# Patient Record
Sex: Female | Born: 1940 | Race: White | Hispanic: No | Marital: Married | Smoking: Former smoker
Health system: Southern US, Community
[De-identification: ages and names within clinical notes are randomized; demographics above are authoritative.]

## PROBLEM LIST (undated history)

## (undated) DIAGNOSIS — E785 Hyperlipidemia, unspecified: Secondary | ICD-10-CM

## (undated) DIAGNOSIS — M19042 Primary osteoarthritis, left hand: Secondary | ICD-10-CM

## (undated) DIAGNOSIS — M19071 Primary osteoarthritis, right ankle and foot: Secondary | ICD-10-CM

## (undated) DIAGNOSIS — M19041 Primary osteoarthritis, right hand: Secondary | ICD-10-CM

## (undated) DIAGNOSIS — R918 Other nonspecific abnormal finding of lung field: Secondary | ICD-10-CM

## (undated) DIAGNOSIS — M1990 Unspecified osteoarthritis, unspecified site: Secondary | ICD-10-CM

## (undated) DIAGNOSIS — J40 Bronchitis, not specified as acute or chronic: Secondary | ICD-10-CM

## (undated) DIAGNOSIS — M19072 Primary osteoarthritis, left ankle and foot: Secondary | ICD-10-CM

## (undated) DIAGNOSIS — J329 Chronic sinusitis, unspecified: Secondary | ICD-10-CM

## (undated) DIAGNOSIS — M419 Scoliosis, unspecified: Secondary | ICD-10-CM

## (undated) HISTORY — DX: Primary osteoarthritis, right hand: M19.041

## (undated) HISTORY — DX: Primary osteoarthritis, left ankle and foot: M19.072

## (undated) HISTORY — DX: Primary osteoarthritis, left hand: M19.042

## (undated) HISTORY — DX: Hyperlipidemia, unspecified: E78.5

## (undated) HISTORY — DX: Scoliosis, unspecified: M41.9

## (undated) HISTORY — PX: APPENDECTOMY: SHX54

## (undated) HISTORY — PX: TOTAL ABDOMINAL HYSTERECTOMY: SHX209

## (undated) HISTORY — DX: Other nonspecific abnormal finding of lung field: R91.8

## (undated) HISTORY — DX: Chronic sinusitis, unspecified: J32.9

## (undated) HISTORY — DX: Primary osteoarthritis, right ankle and foot: M19.071

## (undated) HISTORY — DX: Unspecified osteoarthritis, unspecified site: M19.90

## (undated) HISTORY — DX: Bronchitis, not specified as acute or chronic: J40

---

## 1997-11-02 ENCOUNTER — Observation Stay (HOSPITAL_COMMUNITY): Admission: RE | Admit: 1997-11-02 | Discharge: 1997-11-03 | Payer: Self-pay | Admitting: Internal Medicine

## 1997-11-22 ENCOUNTER — Ambulatory Visit (HOSPITAL_COMMUNITY): Admission: RE | Admit: 1997-11-22 | Discharge: 1997-11-22 | Payer: Self-pay | Admitting: Internal Medicine

## 1998-05-25 ENCOUNTER — Ambulatory Visit (HOSPITAL_COMMUNITY): Admission: RE | Admit: 1998-05-25 | Discharge: 1998-05-25 | Payer: Self-pay | Admitting: Obstetrics & Gynecology

## 1998-08-16 ENCOUNTER — Other Ambulatory Visit: Admission: RE | Admit: 1998-08-16 | Discharge: 1998-08-16 | Payer: Self-pay | Admitting: Internal Medicine

## 2000-09-09 ENCOUNTER — Encounter: Payer: Self-pay | Admitting: Internal Medicine

## 2000-09-09 ENCOUNTER — Encounter: Admission: RE | Admit: 2000-09-09 | Discharge: 2000-09-09 | Payer: Self-pay | Admitting: Internal Medicine

## 2003-07-07 ENCOUNTER — Ambulatory Visit (HOSPITAL_COMMUNITY): Admission: RE | Admit: 2003-07-07 | Discharge: 2003-07-07 | Payer: Self-pay | Admitting: Gastroenterology

## 2004-09-09 ENCOUNTER — Emergency Department (HOSPITAL_COMMUNITY): Admission: EM | Admit: 2004-09-09 | Discharge: 2004-09-09 | Payer: Self-pay | Admitting: Emergency Medicine

## 2005-12-31 ENCOUNTER — Encounter: Admission: RE | Admit: 2005-12-31 | Discharge: 2005-12-31 | Payer: Self-pay | Admitting: *Deleted

## 2006-01-07 ENCOUNTER — Encounter: Admission: RE | Admit: 2006-01-07 | Discharge: 2006-01-07 | Payer: Self-pay | Admitting: *Deleted

## 2006-01-21 ENCOUNTER — Encounter: Admission: RE | Admit: 2006-01-21 | Discharge: 2006-01-21 | Payer: Self-pay | Admitting: Internal Medicine

## 2006-01-27 ENCOUNTER — Encounter: Admission: RE | Admit: 2006-01-27 | Discharge: 2006-01-27 | Payer: Self-pay | Admitting: Internal Medicine

## 2006-06-01 ENCOUNTER — Encounter: Payer: Self-pay | Admitting: Internal Medicine

## 2006-06-01 ENCOUNTER — Encounter: Admission: RE | Admit: 2006-06-01 | Discharge: 2006-06-01 | Payer: Self-pay | Admitting: Internal Medicine

## 2006-06-05 ENCOUNTER — Encounter: Admission: RE | Admit: 2006-06-05 | Discharge: 2006-06-05 | Payer: Self-pay | Admitting: Internal Medicine

## 2006-06-11 ENCOUNTER — Ambulatory Visit: Payer: Self-pay | Admitting: Internal Medicine

## 2006-08-05 ENCOUNTER — Ambulatory Visit: Payer: Self-pay | Admitting: Internal Medicine

## 2006-08-05 LAB — CONVERTED CEMR LAB
Basophils Absolute: 0 10*3/uL (ref 0.0–0.1)
Eosinophils Absolute: 0.1 10*3/uL (ref 0.0–0.6)
MCHC: 34.2 g/dL (ref 30.0–36.0)
Monocytes Absolute: 0.9 10*3/uL — ABNORMAL HIGH (ref 0.2–0.7)
Neutrophils Relative %: 67.5 % (ref 43.0–77.0)
RDW: 12.6 % (ref 11.5–14.6)
WBC: 9.8 10*3/uL (ref 4.5–10.5)

## 2006-12-30 ENCOUNTER — Ambulatory Visit (HOSPITAL_COMMUNITY): Admission: RE | Admit: 2006-12-30 | Discharge: 2006-12-30 | Payer: Self-pay | Admitting: Plastic Surgery

## 2007-07-13 IMAGING — CR DG CHEST 2V
2 series · 2 of 2 positions shown · non-contrast
Comparison: [REDACTED] chest x-ray 01/07/06, 12/31/05, and [REDACTED] chest x-ray report 09/09/00 (purged from file).

CLINICAL DATA: Previous smoker.  Followup pneumonia.  Some cough and congestion.  Slight improvement of symptoms.  Shortness of breath. 
 DIAGNOSTIC CHEST - 2 VIEW:

[view not recorded (1 of 2)]
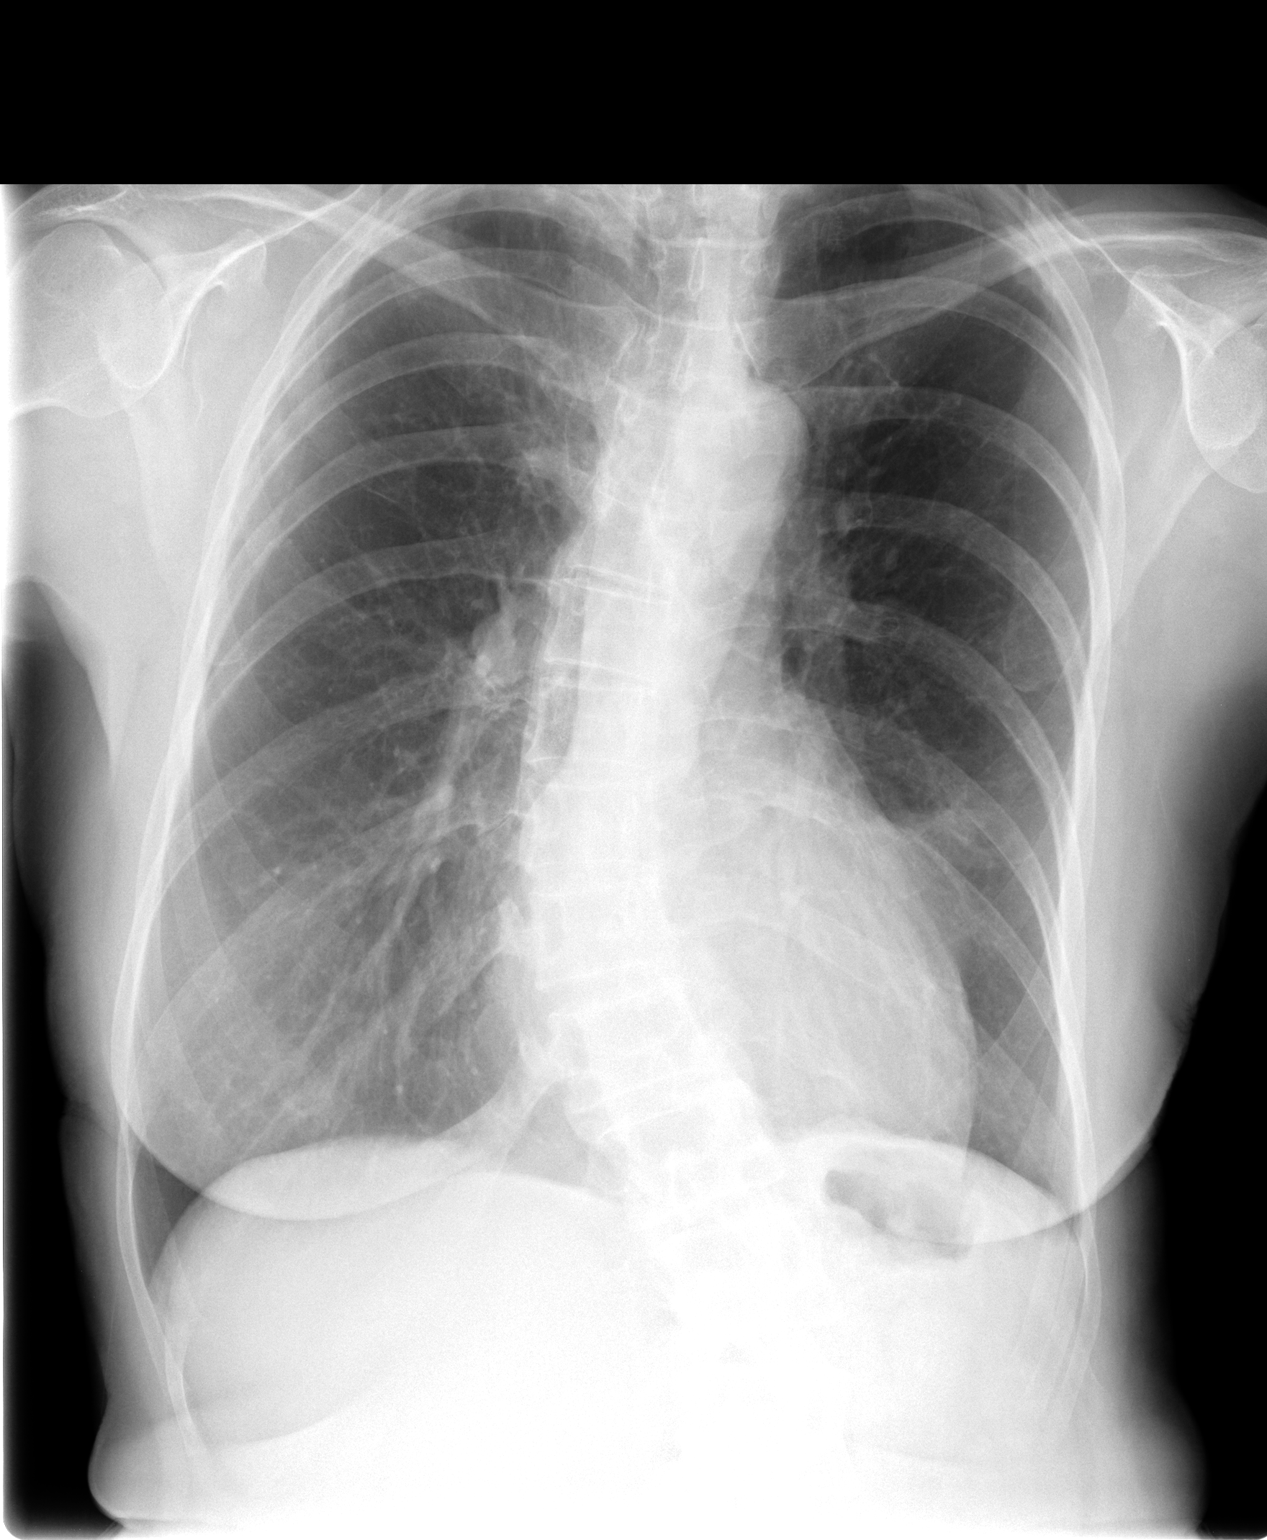

[view not recorded (2 of 2)]
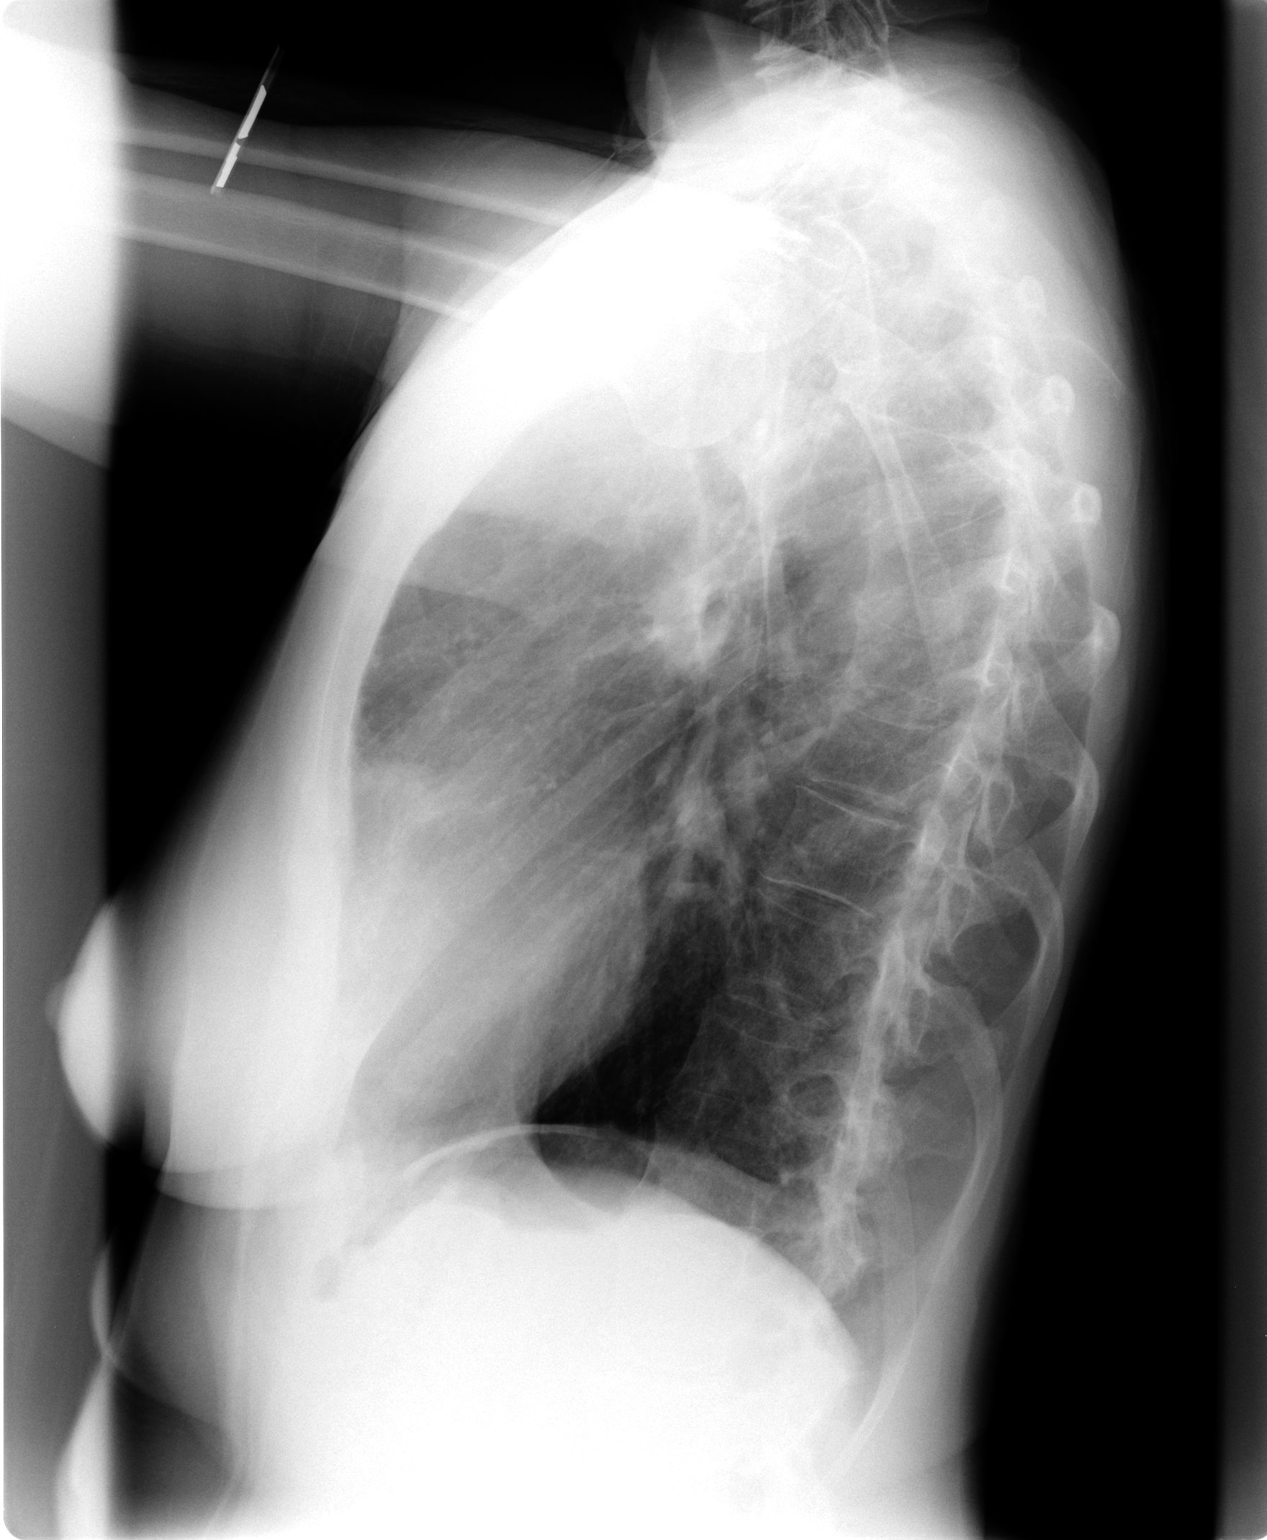

[2 of 2 positions shown; findings below may reference images not displayed]

FINDINGS: Amorphous 3 to 4 cm triangular opacity is seen of the lingula unchanged since prior studies since 12/31/05.  Stable hyperinflation of COPD is noted.  Heart size is normal.  Mediastinum, hila, pleura, and osseous structures to include moderate dextroscoliosis dorsal spine are unchanged.
IMPRESSION: 1.  No change in triangular-shaped lingular opacity consistent with persistent bronchopneumonia, atelectasis, scar, or neoplasm.   Recommend followup chest x-ray and/or chest CT to complete clearing. 
 2.  Stable slight COPD. 
 3.  Otherwise no active disease.

## 2010-03-19 ENCOUNTER — Encounter
Admission: RE | Admit: 2010-03-19 | Discharge: 2010-03-19 | Payer: Self-pay | Source: Home / Self Care | Attending: Family Medicine | Admitting: Family Medicine

## 2010-03-19 ENCOUNTER — Encounter: Payer: Self-pay | Admitting: Internal Medicine

## 2010-03-28 ENCOUNTER — Encounter
Admission: RE | Admit: 2010-03-28 | Discharge: 2010-03-28 | Payer: Self-pay | Source: Home / Self Care | Attending: Family Medicine | Admitting: Family Medicine

## 2010-04-30 ENCOUNTER — Encounter: Payer: Self-pay | Admitting: Internal Medicine

## 2010-04-30 ENCOUNTER — Institutional Professional Consult (permissible substitution) (INDEPENDENT_AMBULATORY_CARE_PROVIDER_SITE_OTHER): Payer: Medicare Other | Admitting: Internal Medicine

## 2010-04-30 DIAGNOSIS — J42 Unspecified chronic bronchitis: Secondary | ICD-10-CM

## 2010-04-30 DIAGNOSIS — R042 Hemoptysis: Secondary | ICD-10-CM | POA: Insufficient documentation

## 2010-05-03 DIAGNOSIS — E785 Hyperlipidemia, unspecified: Secondary | ICD-10-CM | POA: Insufficient documentation

## 2010-05-08 NOTE — Assessment & Plan Note (Signed)
Summary: lung nodules/hemoptysis/sh   Primary Provider/Referring Provider:  Dr Eligah East   History of Present Illness: 05-06-2010- 70 yoF seen w/ her husband on kind referral from Dr Paulino Rily because of abnormal CT scan. Previously seen here twice in 2008 for abnormal CT with stable lingular scarring, small nodules and improving Right lung ground glass density. PPD then was neg, SAR. 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 (Father died TB in sanatorium when she was 70 yo- her PPDs always negative) Recently she had bronchitis x 3 weeks, leading to CT1/05/2010:  Compared to 06/01/06 study the tree-in-bud and bronchiectasis were diffusely stable, with possibly increased scarring but no definite active process. alsno noted mild CE w/ coronary artery calcification.  During the bronhcitis she did cough some blood streaaks twice, while on aspirin. She had caught a cold from her grandchildren. She was not coughing before this acute bronchitis and is no longer coughing now. She claims to feel fine, and denies fever, night sweat, pain, nodes.Most recently she had a sinusitis resolved with antibiotics.   Preventive Screening-Counseling & Management  Alcohol-Tobacco     Smoking Status: quit     Packs/Day: 0.75     Year Started: 1972     Year Quit: 1992  Past History:  Family History: Last updated: 2010-05-06 Father died TB in sanatorium- patient was 70 yo- Her PPDs always                                                                              negative Mother - died pneumonia after a fall, 32 yo  Social History: Last updated: May 06, 2010 Married- Dannetta Lekas, children and grandchildren Retired [employer] college  Risk Factors: Smoking Status: quit (05-06-10) Packs/Day: 0.75 (05-06-2010)  Past Medical History: Bronchitis Lung nodules- CT 2008, 03/19/10 Hyperlipidemia sinusitis  Past Surgical  History: Appendectomy Total Abdominal Hysterectomy  Family History: Father died TB in sanatorium- patient was 70 yo- Her PPDs always                                                                              negative Mother - died pneumonia after a fall, 75 yo  Social History: Married- Eriel Dunckel, children and grandchildren 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  The patient denies shortness of breath at rest, productive cough, non-productive cough, coughing up blood, chest pain, irregular heartbeats, acid heartburn, indigestion, loss of appetite, weight change, abdominal pain, difficulty swallowing, sore throat, tooth/dental problems, headaches, nasal congestion/difficulty breathing through nose, sneezing, itching, ear ache, hand/feet swelling, rash, change in color of mucus, and fever.    Physical Exam  Additional Exam:  General: A/Ox3; pleasant and cooperative, NAD, slim, well-appearing  SKIN: no rash, lesions NODES: no lymphadenopathy HEENT: Ebro/AT, EOM- WNL, Conjuctivae- clear, PERRLA, TM-WNL, Nose- clear, Throat- clear and wnl, Mallampati  II NECK: Supple w/ fair ROM, JVD- none, normal carotid impulses w/o bruits Thyroid- normal to palpation CHEST: Clear to P&A, few inspiratory squeeks at R midback, no cough, unlabored HEART: RRR, no m/g/r heard ABDOMEN: Soft and nl; nml bowel sounds; no organomegaly or masses noted ZOX:WRUE, nl pulses, no edema, cyanosis or clubbing NEURO: Grossly intact to observation      Impression & Recommendations:  Problem # 1:  BRONCHITIS, CHRONIC (ICD-491.9) CXR and CT strongly suggest MAIC. Since she is currently completely asymptomatic, and was before the acute illness, we will follow only at long intervals, unless her pattern changes. She and her husband are comfortable with this approach, but we made it clear that if  symptoms changed, we would see her sooner. .   Problem # 2:  HEMOPTYSIS UNSPECIFIED (ICD-786.30) Transient bleed at time of bronchitis while on aspirin and without concerning lesion on CT. This was probably benign hemorrhagic bronchitis involving one of her bronhiectatic areas. Recurrent bleed would require reconsideration- probably bronchoscopy looking for [facility] or neoplasm.   Other Orders: Consultation Level IV (45409)  Patient Instructions: 1)  Please schedule a follow-up appointment in 1 year. 2)  If you find problems developing- persistent change in cough, night sweats, fever or coughing blood, then please let me know.  3)  Dr Paulino Rily

## 2010-06-19 ENCOUNTER — Other Ambulatory Visit: Payer: Self-pay | Admitting: Family Medicine

## 2010-06-19 DIAGNOSIS — R911 Solitary pulmonary nodule: Secondary | ICD-10-CM

## 2010-07-17 ENCOUNTER — Other Ambulatory Visit: Payer: Self-pay | Admitting: Family Medicine

## 2010-07-17 DIAGNOSIS — R9389 Abnormal findings on diagnostic imaging of other specified body structures: Secondary | ICD-10-CM

## 2010-07-30 NOTE — Assessment & Plan Note (Signed)
[facility]                             PULMONARY OFFICE NOTE   NAME:[name] [name], [name] [name]                   MRN:          [id number]  DATE:08/05/2006                            DOB:          01/15/1941    PROBLEMS:  1. Small bilateral lung nodules.  2. Seasonal allergic rhinitis.  3. Cough.  4. Chest pain.   HISTORY:  When I had seen her on March 27 initially, we had placed a PPD  skin test which was negative and had intended pulmonary function test be  scheduled, but these were not done. She had been visiting in [place],  [place] over the past weekend after driving down. She developed pleuritic  right lateral mid chest pain which took her breath and went to a  hospital there where she had a chest x-ray and EKG. The chest x-ray disc  shows two areas of faint rounded density in the right lung, no obvious  plural effusion, pneumothorax, or other definite process. This is  compared with her CT scan of March 17 which had seen no evidence of  enlarging lung nodule, but had noted progression of scattered clustered  micro nodules suspicious for [facility] and also a probable hypodense right  hepatic lobe lesion. She was diagnosed with pleurisy. She apparently was  not given any specific therapy and did not have much in the way of lab  tests. I noted that she had driven to [place], as she is about to fly  now to the [place] for a vacation. She has had some leg cramps off and  on bilaterally, apparently not new. She has no history of DVT in self or  family. One sister had a CVA.   OBJECTIVE:  Weight 125 pounds, blood pressure 110/68, pulse regular 79,  room air saturation 99%. Alert, trim, and apparently comfortable with a  dry cough. No plural rub, rales, or wheeze. Heart sounds regular without  murmur. No neck vein distension. No adenopathy. Trim legs with no cords,  no tenderness, negative Homan's, no cyanosis or clubbing.   IMPRESSION:  1. 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  2. New onset of pleuritic pain which is now substantially improved.      This may have been something musculoskeletal like a pinched nerve      (she blames picking up her grandchild), but I cannot rule out      pulmonary embolism and this was discussed with her and her husband.      This was earlier than we had otherwise intended repeating her CT      scan.   PLAN:  1. Avoid stasis in the legs.  2. CBC with diff and D-dimer are drawn.  3. If D-dimer returns abnormal, we will work her up for a DVT,      although her legs would seem      to be low risk.  4. We are scheduling appointment in two months, anticipating follow up      CT scan, earlier p.r.n.     Clinton D. Maple Hudson, MD, FCCP, FACP  Electronically Signed  CDY/MedQ  DD: 08/05/2006  DT: 08/06/2006  Job #: [id number]   cc:   [facility] @ [facility]

## 2010-08-02 NOTE — Assessment & Plan Note (Signed)
[facility]                             PULMONARY OFFICE NOTE   NAME:[name], [name] CHILCOTT                   MRN:          [id number]  DATE:06/11/2006                            DOB:          26-Aug-1940    PROBLEM:  A 70 year old former smoker, seen at the kind request of Dr.  Olena Leatherwood in pulmonary consultation because of an abnormal chest CT.   HISTORY:  She has had a history of some episodic bronchitis.  In the  Fall of 2007, she had an illness of cough, but no fever or purulent  sputum.  Chest x-ray was interpreted as pneumonia.  She took a  prednisone taper then an antibiotic and felt well for 4-6 weeks.  Now in  the past 2-3 weeks she has had rhinorrhea, recurrence of cough and some  sore throat which she blames on allergy.  This by taking daily Claritin  on a chronic basis.  Cough is usually worst at night and on first waking  in the morning.  She may be aggravated with exposure to smoke and  strong, new fabric odors, which also cause sneezing.  Chest x-ray  suggested a nodule and she had a chest CT at [facility] on  November 13 which related the nodule to an area of scarring in the  lingula, but also noted  ground glass opacity in the right perihilar  region and a 7 mm nodule in the left upper lobe.  Followup CT was  recommended, and subsequently obtained on March 17 of this year.  The  second CT showed no evidence of enlarging lung nodule, but demonstrated  stable small nodules and an area of ground glass density in the right  lung which had improved.  Ongoing surveillance is recommended.  Progression of scattered, clustered micro nodules, especially in the mid  lung zones, was associated with some tree-in-bud change, and the  possibility of atypical infection, such as mycobacterium avium infection  was raised.  An abnormal area on the right liver was commented on.   MEDICATIONS:  1. Aspirin 81 mg.  2. Claritin D.  3. Prilosec.  4.  Multivitamin  5. Fish oil.  6. Glucosamine.  7. Fosamax.  8. Lunesta 2 mg.   Drug intolerant to CODEINE.   REVIEW OF SYSTEMS:  Nonproductive cough, sore throats.  Weight has been  stable.  Sputum white.  She has not had recent fever, adenopathy, rash,  bleeding, or chest pain.  She needs Prilosec to prevent heartburn, but  does not feel reflux events.  Her breathing does not wake her.   PAST HISTORY:  Seasonal allergic rhinitis and asthma as a child.  Elevated cholesterol.  Surgeries for hysterectomy and appendectomy.  Tuberculosis exposure to her father who died of tuberculosis when she  was 70 years old.  She thinks he may have gone to a sanatorium for a  while.  Her TB skin tests have always been negative.  She denies  intolerance to latex, contrast dye, or aspirin.   SOCIAL HISTORY:  Smoked 1/2 pack per day for 18-20 years, quitting 10  years ago.  Married, retired as Photographer in an office  environment.  She had traveled to [place] 5 or 6 years ago, and lived  for 3 years in [place].  Home environment:  Old home, no basement, oil  heat, carpet, no pets, no significant mold or mildew.  Husband sleeps on  a feather pillow, hers is foam.   FAMILY HISTORY:  Sister with cerebrovascular accident.  Mother and  grandmother had rheumatoid arthritis.  Nobody with known respiratory  problems.   OBJECTIVE:  Weight 132 pounds, BP 120/70, pulse regular 80, room air  saturation 98%.  Comfortable-appearing woman of medium build.  SKIN:  No rash.  ADENOPATHY:  None found.  HEENT:  Nose and throat clear.  No suggestion of pharyngeal irritation  or postnasal drip.  No stridor, neck vein distension, or hoarseness.  CHEST:  Mild, dry cough, question trace crackle.  Work of breathing was  not increased, there was no dullness or rub.  Heart sounds were regular without murmur or gallop.  I could not feel liver or spleen.  EXTREMITIES:  Without cyanosis, clubbing, or edema.    RADIOLOGY:  CT reports as above.   IMPRESSION:  1. Small bilateral lung nodules, would be consistent with an atypical      infection such as MAIC, but note that she had possible childhood      exposure to tuberculosis.  2. Seasonal allergic rhinitis.  3. Cough, possibly multifactorial.   PLAN:  1. We have placed a TB skin test.  2. Schedule pulmonary functions.  3. She will compare Zyrtec and Claritin over the counter for      antihistamine effect.  4. Schedule return in 1 month to follow up on tests, and to discuss      how to follow her chest CT picture.  We did not ask for sputum      culture, because currently she is not bringing up anything.  I      discussed availability of bronchoscopy to sample her lungs, but we      felt that we should wait as long as she was relatively      asymptomatic, but we are watching the status of her cough.  I      appreciate the chance to meet her.    Clinton D. Maple Hudson, MD, Tonny Bollman, FACP  Electronically Signed   CDY/MedQ  DD: 06/12/2006  DT: 06/13/2006  Job #: [id number]   cc:   Ladell Pier, M.D.

## 2010-09-16 ENCOUNTER — Other Ambulatory Visit: Payer: Self-pay | Admitting: Family Medicine

## 2010-09-16 DIAGNOSIS — E042 Nontoxic multinodular goiter: Secondary | ICD-10-CM

## 2010-12-03 ENCOUNTER — Other Ambulatory Visit: Payer: Self-pay | Admitting: Family Medicine

## 2010-12-03 DIAGNOSIS — E042 Nontoxic multinodular goiter: Secondary | ICD-10-CM

## 2010-12-04 ENCOUNTER — Ambulatory Visit
Admission: RE | Admit: 2010-12-04 | Discharge: 2010-12-04 | Disposition: A | Discharge status: Home / Self Care | Payer: Medicare Other | Source: Ambulatory Visit | Attending: Family Medicine | Admitting: Family Medicine

## 2010-12-04 DIAGNOSIS — E042 Nontoxic multinodular goiter: Secondary | ICD-10-CM

## 2011-05-01 ENCOUNTER — Ambulatory Visit: Payer: Medicare Other | Admitting: Internal Medicine

## 2011-05-22 ENCOUNTER — Other Ambulatory Visit: Payer: Self-pay | Admitting: Family Medicine

## 2011-05-22 DIAGNOSIS — E041 Nontoxic single thyroid nodule: Secondary | ICD-10-CM

## 2011-05-26 ENCOUNTER — Encounter: Payer: Self-pay | Admitting: Internal Medicine

## 2011-05-27 ENCOUNTER — Ambulatory Visit (INDEPENDENT_AMBULATORY_CARE_PROVIDER_SITE_OTHER): Payer: Medicare Other | Admitting: Internal Medicine

## 2011-05-27 ENCOUNTER — Encounter: Payer: Self-pay | Admitting: Internal Medicine

## 2011-05-27 DIAGNOSIS — J42 Unspecified chronic bronchitis: Secondary | ICD-10-CM

## 2011-05-27 DIAGNOSIS — J479 Bronchiectasis, uncomplicated: Secondary | ICD-10-CM

## 2011-05-27 DIAGNOSIS — R042 Hemoptysis: Secondary | ICD-10-CM

## 2011-05-27 DIAGNOSIS — I2510 Atherosclerotic heart disease of native coronary artery without angina pectoris: Secondary | ICD-10-CM

## 2011-05-27 NOTE — Patient Instructions (Signed)
Order- Non Contrast CT Chest - dx bronchiectasis, compare with 2012  We will call you with the CT results.  Please call as needed

## 2011-05-27 NOTE — Progress Notes (Signed)
05/27/11- 70 yoF followed for hx chronic bronchitis w/ hx hemoptysis LOV- 04/30/10  Husband here He is back from vacation in [place]. Head cold with sinus infection which resolved with 2 rounds of Z-Pak. Feels well. No blood. No recent cough, night sweats, swollen glands or shortness of breath. She had had pneumonia a year and a half ago. CT chest 03/19/2010-"tree in bud" appearance in the upper lobes with mild bronchiectasis suggesting atypical AFB infection. Mild cardiomegaly with coronary artery calcification. We reviewed these. Her sister had had CVA x2. Father died of tuberculosis.  ROS-see HPI Constitutional:   No-   weight loss, night sweats, fevers, chills, fatigue, lassitude. HEENT:   No-  headaches, difficulty swallowing, tooth/dental problems, sore throat,       No-  sneezing, itching, ear ache, nasal congestion, post nasal drip,  CV:  No-   chest pain, orthopnea, PND, swelling in lower extremities, anasarca, dizziness, palpitations Resp: No-   shortness of breath with exertion or at rest.              No-   productive cough,  No non-productive cough,  No- coughing up of blood.              No-   change in color of mucus.  No- wheezing.   Skin: No-   rash or lesions. GI:  No-   heartburn, indigestion, abdominal pain, nausea, vomiting GU: . MS:  No-   joint pain or swelling.  Neuro-     nothing unusual Psych:  No- change in mood or affect. No depression or anxiety.  No memory loss.  OBJ- Physical Exam General- Alert, Oriented, Affect-appropriate, Distress- none acute Skin- rash-none, lesions- none, excoriation- none Lymphadenopathy- none Head- atraumatic            Eyes- Gross vision intact, PERRLA, conjunctivae and secretions clear            Ears- Hearing, canals-normal            Nose- Clear, no-Septal dev, mucus, polyps, erosion, perforation             Throat- Mallampati II , mucosa clear , drainage- none, tonsils- atrophic Neck- flexible , trachea midline, no stridor ,  thyroid nl, carotid no bruit Chest - symmetrical excursion , unlabored           Heart/CV- RRR , no murmur , no gallop  , no rub, nl s1 s2                           - JVD- none , edema- none, stasis changes- none, varices- none           Lung- clear to P&A, wheeze- none, cough- none , dullness-none, rub- none           Chest wall-  Abd- Br/ Gen/ Rectal- Not done, not indicated Extrem- cyanosis- none, clubbing, none, atrophy- none, strength- nl Neuro- grossly intact to observation

## 2011-05-31 DIAGNOSIS — I2510 Atherosclerotic heart disease of native coronary artery without angina pectoris: Secondary | ICD-10-CM | POA: Insufficient documentation

## 2011-05-31 NOTE — Assessment & Plan Note (Signed)
I explained this observation does not necessarily imply impending occlusion. I asked her to discuss it with Dr. Cliffton Asters.

## 2011-05-31 NOTE — Assessment & Plan Note (Signed)
Context was consistent with a hemorrhagic bronchitis. There has been no recurrence.

## 2011-05-31 NOTE — Assessment & Plan Note (Signed)
We discussed options and decided to get a followup chest CT, looking for progression of her bronchiectasis despite lack of symptoms.

## 2011-06-03 ENCOUNTER — Ambulatory Visit
Admission: RE | Admit: 2011-06-03 | Discharge: 2011-06-03 | Disposition: A | Discharge status: Home / Self Care | Payer: Medicare Other | Source: Ambulatory Visit | Attending: Family Medicine | Admitting: Family Medicine

## 2011-06-03 ENCOUNTER — Ambulatory Visit (INDEPENDENT_AMBULATORY_CARE_PROVIDER_SITE_OTHER)
Admission: RE | Admit: 2011-06-03 | Discharge: 2011-06-03 | Disposition: A | Discharge status: Home / Self Care | Payer: Medicare Other | Source: Ambulatory Visit | Attending: Internal Medicine | Admitting: Internal Medicine

## 2011-06-03 DIAGNOSIS — J479 Bronchiectasis, uncomplicated: Secondary | ICD-10-CM

## 2011-06-03 DIAGNOSIS — E041 Nontoxic single thyroid nodule: Secondary | ICD-10-CM

## 2011-09-17 IMAGING — US US SOFT TISSUE HEAD/NECK
1 series · 14 of 25 positions shown · non-contrast
Comparison: Chest CT 03/19/2010

CLINICAL DATA: Thyroid nodule

THYROID ULTRASOUND
TECHNIQUE: Ultrasound examination of the thyroid gland and adjacent
soft tissues was performed.

[Series 1: us soft tissue head/neck · 0.05mm/px · 14 of 54 slices shown]
[im 1/54]
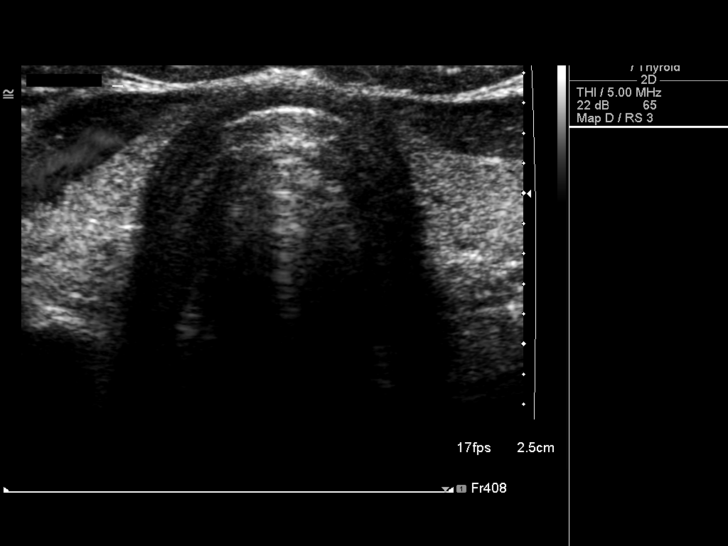
[im 5/54]
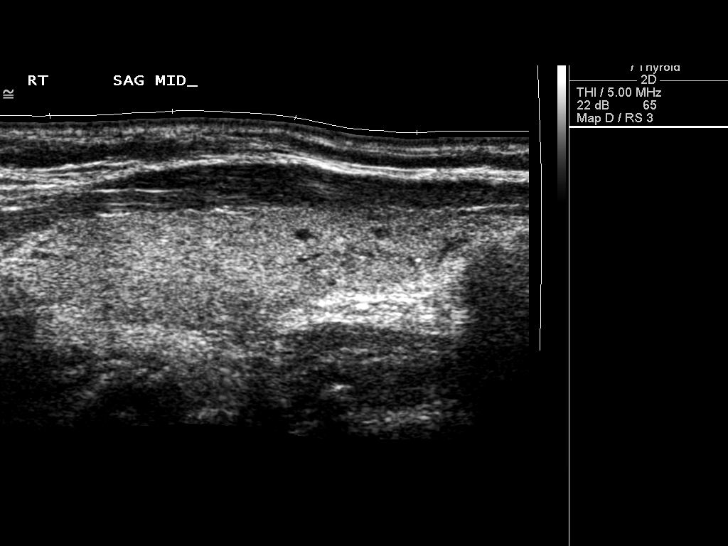
[im 9/54]
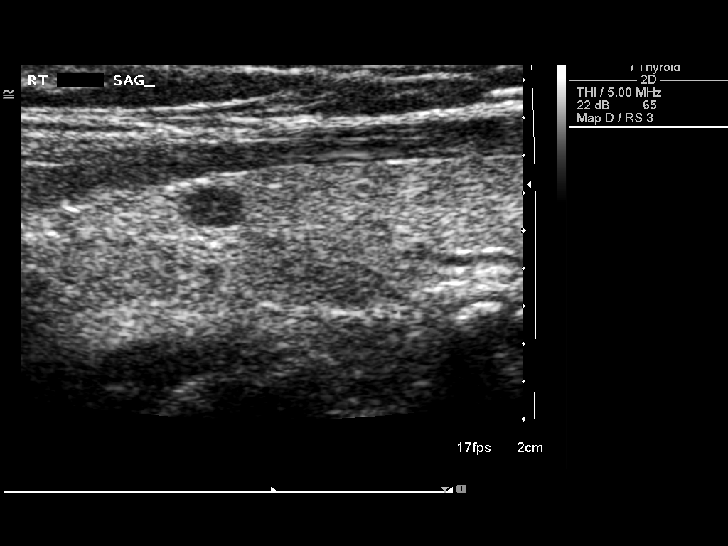
[im 14/54]
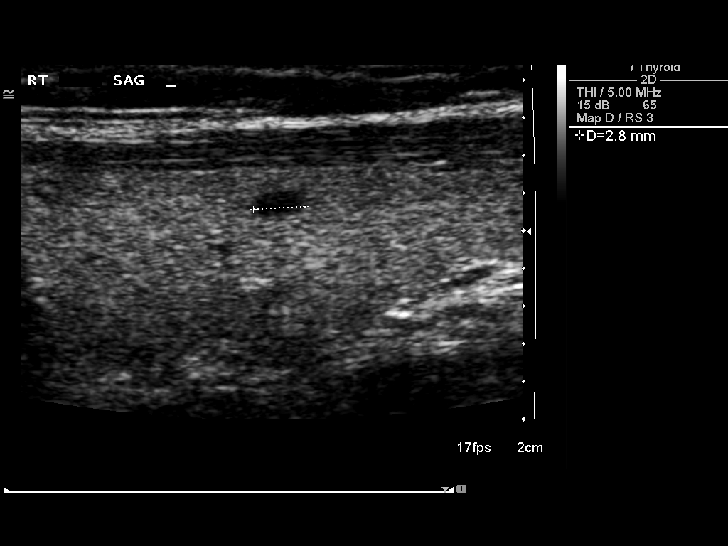
[im 18/54]
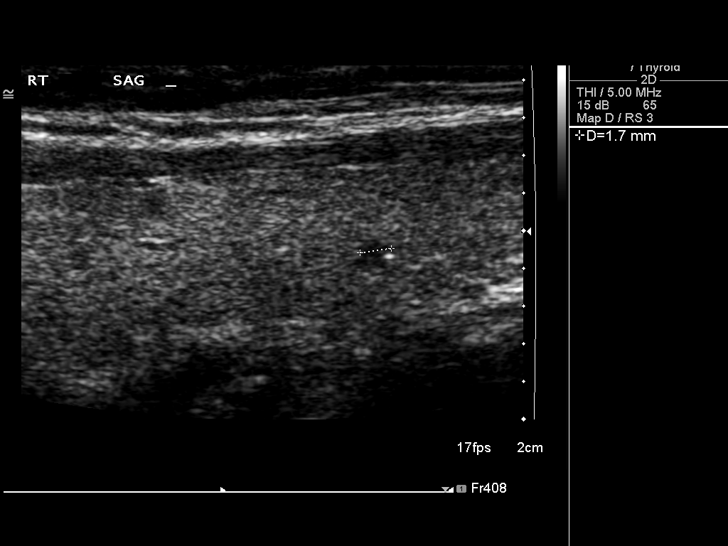
[im 20/54]
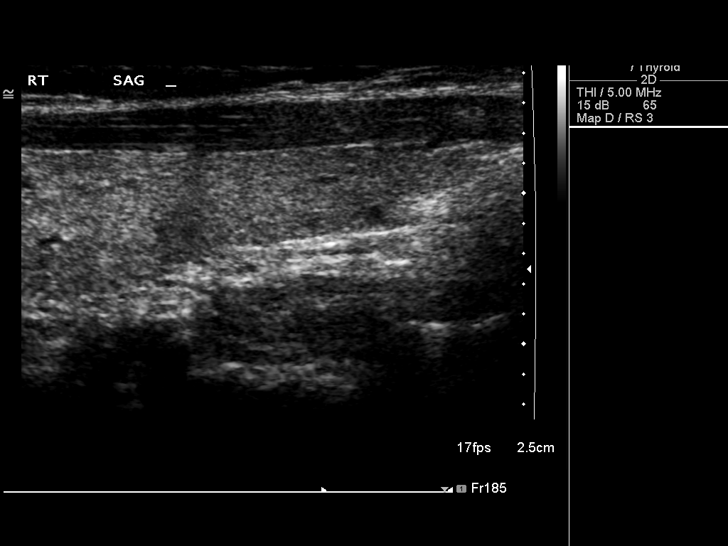
[im 25/54]
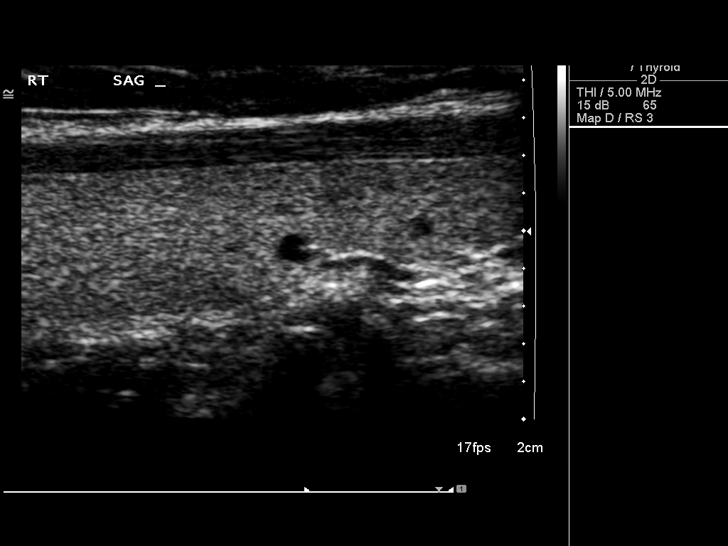
[im 29/54]
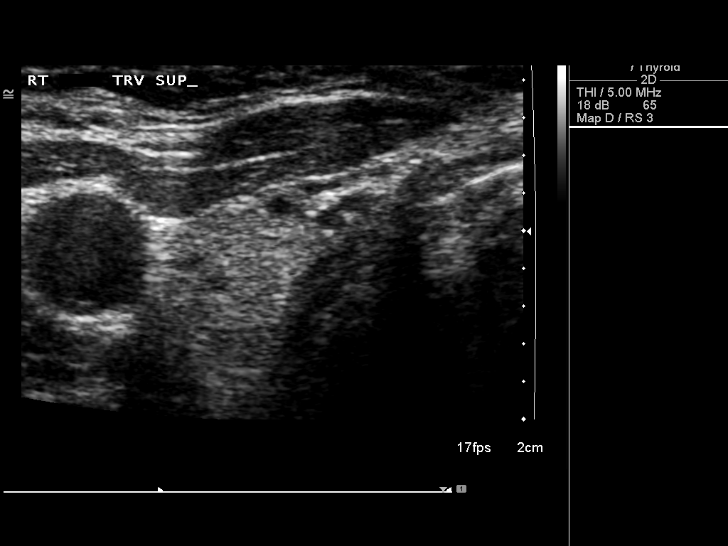
[im 34/54]
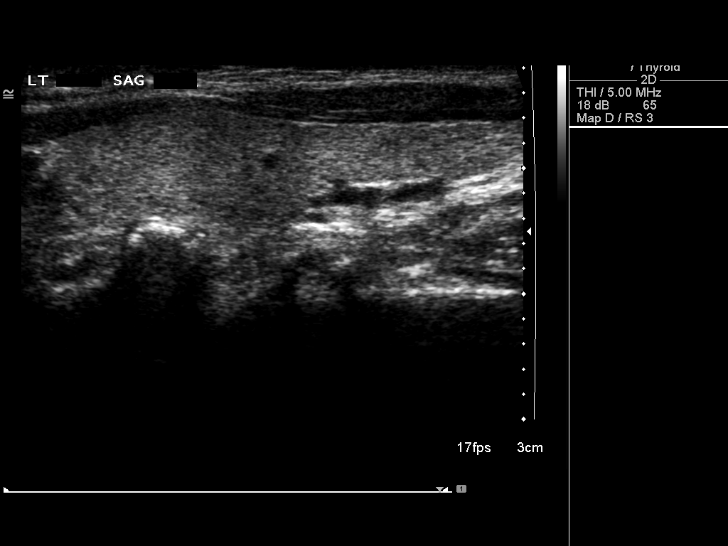
[im 36/54]
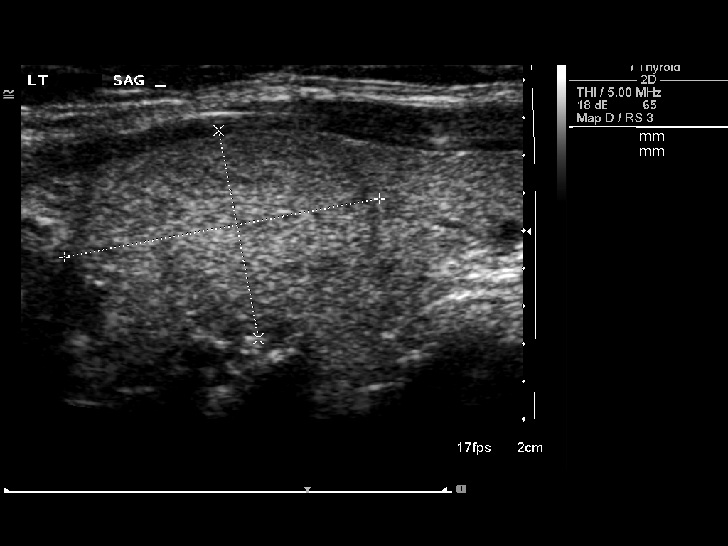
[im 40/54]
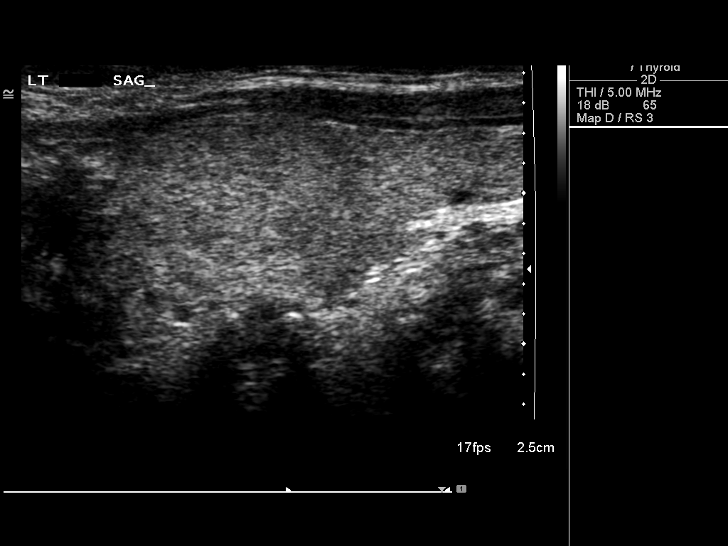
[im 45/54]
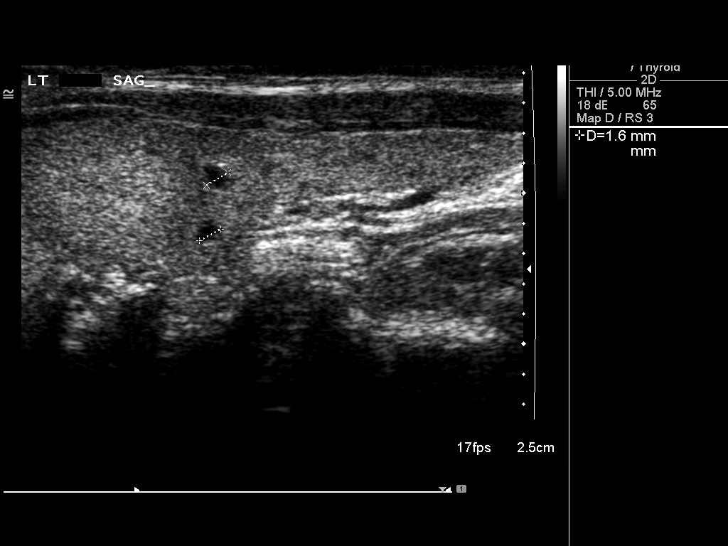
[im 49/54]
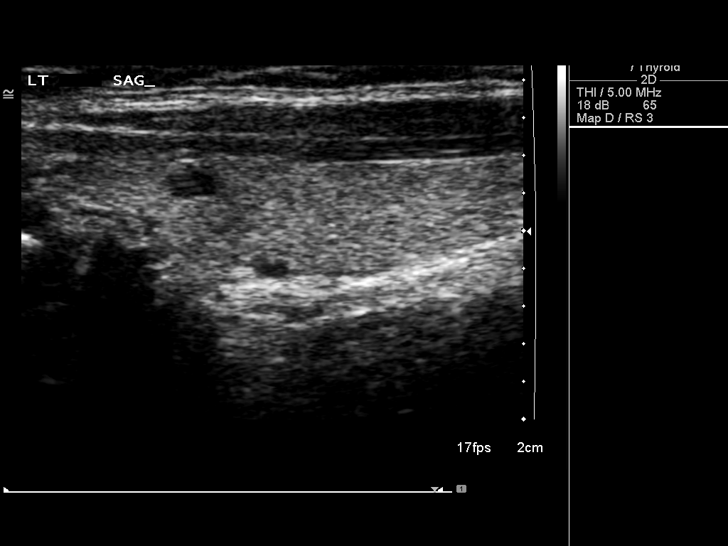
[im 54/54]
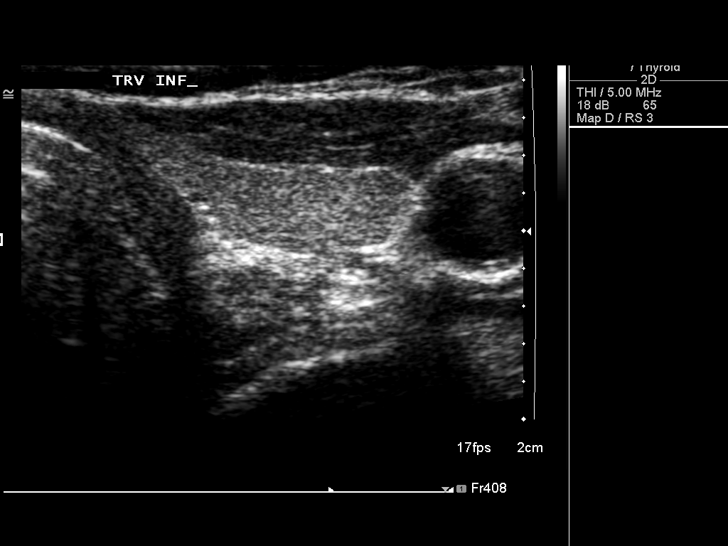

[14 of 25 positions shown; findings below may reference images not displayed]

FINDINGS: Right thyroid lobe:  4.3 x 1.0 x 1.3 cm.
Left thyroid lobe:  3.6 x 1.3 x 1.2 cm.
Isthmus:  8 mm.

Focal nodules:  There are several small subcentimeteric thyroid
nodules bilaterally.  The largest nodule is in the left upper pole,
which is a solid nodule measuring 1.7 x 1.1 x 1.1 cm.

Lymphadenopathy:  None visualized.
IMPRESSION: Multiple bilateral thyroid nodules.  The largest is a solid nodule
in the left upper pole measuring 1.7 cm.  Recommend follow-up
ultrasound in 6-12 months.

## 2012-05-26 ENCOUNTER — Ambulatory Visit: Payer: Medicare Other | Admitting: Internal Medicine

## 2012-06-17 ENCOUNTER — Other Ambulatory Visit: Payer: Self-pay | Admitting: Family Medicine

## 2012-06-17 DIAGNOSIS — E049 Nontoxic goiter, unspecified: Secondary | ICD-10-CM

## 2012-06-18 ENCOUNTER — Other Ambulatory Visit: Payer: Medicare Other

## 2012-06-18 ENCOUNTER — Ambulatory Visit (INDEPENDENT_AMBULATORY_CARE_PROVIDER_SITE_OTHER): Payer: Medicare Other | Admitting: Internal Medicine

## 2012-06-18 ENCOUNTER — Ambulatory Visit (INDEPENDENT_AMBULATORY_CARE_PROVIDER_SITE_OTHER)
Admission: RE | Admit: 2012-06-18 | Discharge: 2012-06-18 | Disposition: A | Discharge status: Home / Self Care | Payer: Medicare Other | Source: Ambulatory Visit | Attending: Internal Medicine | Admitting: Internal Medicine

## 2012-06-18 ENCOUNTER — Encounter: Payer: Self-pay | Admitting: Internal Medicine

## 2012-06-18 VITALS — BP 110/78 | HR 77 | Ht 64.0 in | Wt 126.8 lb

## 2012-06-18 DIAGNOSIS — J479 Bronchiectasis, uncomplicated: Secondary | ICD-10-CM

## 2012-06-18 DIAGNOSIS — J42 Unspecified chronic bronchitis: Secondary | ICD-10-CM

## 2012-06-18 DIAGNOSIS — Z201 Contact with and (suspected) exposure to tuberculosis: Secondary | ICD-10-CM

## 2012-06-18 NOTE — Patient Instructions (Addendum)
Order- lab- Quant TB gold assay for TB exposure  Order- CXR  Dx bronchiectasis

## 2012-06-18 NOTE — Progress Notes (Signed)
05/27/11- 70 yoF followed for hx chronic bronchitis w/ hx hemoptysis LOV- 04/30/10  Husband here She is back from vacation in [place]. Head cold with sinus infection which resolved with 2 rounds of Z-Pak. Feels well. No blood. No recent cough, night sweats, swollen glands or shortness of breath. She had had pneumonia a year and a half ago. CT chest 03/19/2010-"tree in bud" appearance in the upper lobes with mild bronchiectasis suggesting atypical AFB infection. Mild cardiomegaly with coronary artery calcification. We reviewed these. Her sister had had CVA x2. Father died of tuberculosis.  Jul 09, 2012- 70 yoF followed for hx chronic bronchitis/ bronchiectasis w/ hx hemoptysis               Husband here Yearlly Follow up.  Reports breahting is doing well overall.  Does have a nonprod cough - currently on zpak.  No SOB, wheezing, chest tightness, chest pain, or f/c/s at this time. Just back from trip to [place]. Did well this winter. Nonproductive cough for a few days with sore throat, starting Z-Pak. No hemoptysis. Had pneumonia vaccine 5 years ago. Father had tuberculosis. She had tested negative. CT chest 06/11/11- reviewed with them IMPRESSION:  1. No significant change in upper lobe and right middle lobe  bronchiectasis.  2. Reticular pattern within the superior upper lobes and to a  lesser extent the lingula and right middle lobe suggests chronic  infection such as MAI. This is mildly improved compared to most  recent CT.  Original Report Authenticated By: Genevive Bi, M.D.   ROS-see HPI Constitutional:   No-   weight loss, night sweats, fevers, chills, fatigue, lassitude. HEENT:   No-  headaches, difficulty swallowing, tooth/dental problems, sore throat,       No-  sneezing, itching, ear ache, nasal congestion, post nasal drip,  CV:  No-   chest pain, orthopnea, PND, swelling in lower extremities, anasarca, dizziness, palpitations Resp: No-   shortness of breath with exertion or at  rest.              No-   productive cough,  + non-productive cough,  No- coughing up of blood.              No-   change in color of mucus.  No- wheezing.   Skin: No-   rash or lesions. GI:  No-   heartburn, indigestion, abdominal pain, nausea, vomiting GU: . MS:  No-   joint pain or swelling.  Neuro-     nothing unusual Psych:  No- change in mood or affect. No depression or anxiety.  No memory loss.  OBJ- Physical Exam General- Alert, Oriented, Affect-appropriate, Distress- none acute. Looks well. Skin- rash-none, lesions- none, excoriation- none Lymphadenopathy- none Head- atraumatic            Eyes- Gross vision intact, PERRLA, conjunctivae and secretions clear            Ears- Hearing, canals-normal            Nose- Clear, no-Septal dev, mucus, polyps, erosion, perforation             Throat- Mallampati II , mucosa clear , drainage- none, tonsils- atrophic Neck- flexible , trachea midline, no stridor , thyroid nl, carotid no bruit Chest - symmetrical excursion , unlabored           Heart/CV- RRR , no murmur , no gallop  , no rub, nl s1 s2                           -  JVD- none , edema- none, stasis changes- none, varices- none           Lung- clear to P&A, wheeze- none, cough- none , dullness-none, rub- none           Chest wall-  Abd- Br/ Gen/ Rectal- Not done, not indicated Extrem- cyanosis- none, clubbing, none, atrophy- none, strength- nl Neuro- grossly intact to observation

## 2012-06-22 ENCOUNTER — Other Ambulatory Visit: Payer: Medicare Other

## 2012-06-23 LAB — QUANTIFERON TB GOLD ASSAY (BLOOD)
Quantiferon Nil Value: 0.07 IU/mL
TB Ag value: 0.07 IU/mL

## 2012-06-23 NOTE — Progress Notes (Signed)
Quick Note:  LMTCB ______ 

## 2012-06-24 ENCOUNTER — Telehealth: Payer: Self-pay | Admitting: Internal Medicine

## 2012-06-24 NOTE — Progress Notes (Signed)
Quick Note:  Pt aware of results. ______ 

## 2012-06-24 NOTE — Telephone Encounter (Signed)
Result Note    CXR- severe scoliosis of the spine, but no acute lung process seen.   Result Note    Quantiferon gold TB assay. No blood test evidence of prior TB exposure.   LMTCB to advise of the above.

## 2012-06-24 NOTE — Telephone Encounter (Signed)
Spoke with patient aware of results.

## 2012-06-24 NOTE — Telephone Encounter (Signed)
Pt returned call. [name] [name]  

## 2012-06-27 NOTE — Assessment & Plan Note (Signed)
No progression and possible improvement on most recent imaging. No recent hemoptysis. She does not have symptoms suggesting active atypical AFB infection. She would like to be followed up once a year but I reviewed symptoms including cough, weight loss, night sweats, hemoptysis that might call for earlier attention Plan-blood for TB exposure assay because of history that father had TB. Chest x-ray.

## 2012-06-30 ENCOUNTER — Other Ambulatory Visit: Payer: Medicare Other

## 2012-08-02 ENCOUNTER — Other Ambulatory Visit: Payer: Medicare Other

## 2012-08-02 ENCOUNTER — Ambulatory Visit
Admission: RE | Admit: 2012-08-02 | Discharge: 2012-08-02 | Disposition: A | Discharge status: Home / Self Care | Payer: Medicare Other | Source: Ambulatory Visit | Attending: Family Medicine | Admitting: Family Medicine

## 2012-08-02 DIAGNOSIS — E049 Nontoxic goiter, unspecified: Secondary | ICD-10-CM

## 2014-04-11 ENCOUNTER — Other Ambulatory Visit (HOSPITAL_COMMUNITY): Payer: Self-pay | Admitting: Rheumatology

## 2014-04-11 DIAGNOSIS — M542 Cervicalgia: Secondary | ICD-10-CM

## 2014-04-28 ENCOUNTER — Ambulatory Visit (HOSPITAL_COMMUNITY)
Admission: RE | Admit: 2014-04-28 | Discharge: 2014-04-28 | Disposition: A | Discharge status: Home / Self Care | Payer: Medicare Other | Source: Ambulatory Visit | Attending: Rheumatology | Admitting: Rheumatology

## 2014-04-28 DIAGNOSIS — M47892 Other spondylosis, cervical region: Secondary | ICD-10-CM | POA: Insufficient documentation

## 2014-04-28 DIAGNOSIS — M4802 Spinal stenosis, cervical region: Secondary | ICD-10-CM | POA: Insufficient documentation

## 2014-04-28 DIAGNOSIS — M542 Cervicalgia: Secondary | ICD-10-CM

## 2014-08-11 ENCOUNTER — Other Ambulatory Visit: Payer: Self-pay | Admitting: Family Medicine

## 2014-08-11 DIAGNOSIS — E041 Nontoxic single thyroid nodule: Secondary | ICD-10-CM

## 2014-08-21 ENCOUNTER — Ambulatory Visit
Admission: RE | Admit: 2014-08-21 | Discharge: 2014-08-21 | Disposition: A | Discharge status: Home / Self Care | Payer: Medicare Other | Source: Ambulatory Visit | Attending: Family Medicine | Admitting: Family Medicine

## 2014-08-21 ENCOUNTER — Other Ambulatory Visit: Payer: Medicare Other

## 2014-08-21 DIAGNOSIS — E041 Nontoxic single thyroid nodule: Secondary | ICD-10-CM

## 2014-08-25 ENCOUNTER — Other Ambulatory Visit: Payer: Self-pay | Admitting: Family Medicine

## 2014-08-25 DIAGNOSIS — E041 Nontoxic single thyroid nodule: Secondary | ICD-10-CM

## 2014-10-12 ENCOUNTER — Ambulatory Visit
Admission: RE | Admit: 2014-10-12 | Discharge: 2014-10-12 | Disposition: A | Discharge status: Home / Self Care | Payer: Medicare Other | Source: Ambulatory Visit | Attending: Family Medicine | Admitting: Family Medicine

## 2014-10-12 ENCOUNTER — Other Ambulatory Visit (HOSPITAL_COMMUNITY)
Admission: RE | Admit: 2014-10-12 | Discharge: 2014-10-12 | Disposition: A | Discharge status: Home / Self Care | Payer: Medicare Other | Source: Ambulatory Visit | Attending: Interventional Radiology | Admitting: Interventional Radiology

## 2014-10-12 DIAGNOSIS — E041 Nontoxic single thyroid nodule: Secondary | ICD-10-CM | POA: Insufficient documentation

## 2015-09-21 ENCOUNTER — Other Ambulatory Visit: Payer: Self-pay | Admitting: Family Medicine

## 2015-09-21 DIAGNOSIS — E041 Nontoxic single thyroid nodule: Secondary | ICD-10-CM

## 2015-10-26 ENCOUNTER — Other Ambulatory Visit: Payer: Medicare Other

## 2015-11-06 ENCOUNTER — Encounter: Payer: Self-pay | Admitting: Podiatry

## 2015-11-06 ENCOUNTER — Ambulatory Visit (INDEPENDENT_AMBULATORY_CARE_PROVIDER_SITE_OTHER): Payer: Medicare Other

## 2015-11-06 ENCOUNTER — Ambulatory Visit (INDEPENDENT_AMBULATORY_CARE_PROVIDER_SITE_OTHER): Payer: Medicare Other | Admitting: Podiatry

## 2015-11-06 VITALS — BP 119/73 | HR 81 | Resp 16 | Ht 64.0 in | Wt 125.0 lb

## 2015-11-06 DIAGNOSIS — M722 Plantar fascial fibromatosis: Secondary | ICD-10-CM

## 2015-11-06 NOTE — Progress Notes (Signed)
   Subjective:    Patient ID: [name] GelinasPatricia A [name], female    DOB: [birth year], 75 y.o.   MRN: [id number]  HPI: She presents today with a chief complaint of painful right heel times past 2-1/2 weeks. She states that it really just hurts all the time she states that it began after she and her daughter went to [place] and a lot of walking. She states that hurts in the mornings when she is off of it and when she is on it.    Review of Systems  Musculoskeletal: Positive for arthralgias, back pain and myalgias.  Neurological: Positive for light-headedness and headaches.  All other systems reviewed and are negative.      Objective:   Physical Exam: Vital signs are stable she is alert and oriented 3 pulses are strongly palpable. Neurologic sensorium is intact. Deep tendon reflexes are intact and muscle strength is normal. Orthopedic evaluation was resulted was distal to the ankle for range of motion without crepitation. Cutaneous evaluation demonstrates a well-hydrated cutis no erythema edema saline as drainage or odor. She has pain on palpation medically managed with a right heel no pain on medial and lateral compression of the calcaneus. No open wounds or lesions. Radiographs taken today do demonstrate a soft tissue increase in density at the plantar fascia calcaneal insertion site consistent with plantar fasciitis. No fractures are identified.        Assessment & Plan:  Assessment: Plantar fasciitis right heel.  Plan: Inject her right heel today through plantar fascial brace and a night splint. She has diclofenac gel at home which she will try to 3 times a day. We discussed appropriate shoe gear stretching exercises ice therapy should a lot of indications.

## 2015-11-06 NOTE — Patient Instructions (Signed)

## 2015-11-12 ENCOUNTER — Ambulatory Visit
Admission: RE | Admit: 2015-11-12 | Discharge: 2015-11-12 | Disposition: A | Discharge status: Home / Self Care | Payer: Medicare Other | Source: Ambulatory Visit | Attending: Family Medicine | Admitting: Family Medicine

## 2015-11-12 DIAGNOSIS — E041 Nontoxic single thyroid nodule: Secondary | ICD-10-CM

## 2016-01-10 ENCOUNTER — Ambulatory Visit: Payer: Medicare Other | Admitting: Podiatry

## 2016-01-15 ENCOUNTER — Encounter: Payer: Self-pay | Admitting: *Deleted

## 2016-01-15 DIAGNOSIS — M19041 Primary osteoarthritis, right hand: Secondary | ICD-10-CM

## 2016-01-15 DIAGNOSIS — M19071 Primary osteoarthritis, right ankle and foot: Secondary | ICD-10-CM

## 2016-01-15 DIAGNOSIS — M419 Scoliosis, unspecified: Secondary | ICD-10-CM | POA: Insufficient documentation

## 2016-01-15 DIAGNOSIS — M5030 Other cervical disc degeneration, unspecified cervical region: Secondary | ICD-10-CM

## 2016-01-15 DIAGNOSIS — M8580 Other specified disorders of bone density and structure, unspecified site: Secondary | ICD-10-CM | POA: Insufficient documentation

## 2016-01-15 DIAGNOSIS — M19072 Primary osteoarthritis, left ankle and foot: Secondary | ICD-10-CM

## 2016-01-15 DIAGNOSIS — M19042 Primary osteoarthritis, left hand: Secondary | ICD-10-CM

## 2016-01-15 HISTORY — DX: Primary osteoarthritis, right ankle and foot: M19.072

## 2016-01-15 HISTORY — DX: Primary osteoarthritis, right hand: M19.041

## 2016-01-15 HISTORY — DX: Primary osteoarthritis, right ankle and foot: M19.071

## 2016-01-15 NOTE — Progress Notes (Signed)
*IMAGE* Office Visit Note  Patient: [name] [name]             Date of Birth: 1940-07-27           MRN: [id number]             PCP: Cala BradfordWHITE,CYNTHIA S, MD Referring: Laurann MontanaWhite, Cynthia, MD Visit Date: 01/16/2016 Occupation:@GUAROCC @    Subjective:  No chief complaint on file. Follow-up on DDD of the C-spine  History of Present Illness: [name] [name] is a 75 y.o. female last seen on 08/10/2015. Patient was given bilateral trapezius muscle injection by Dr. Corliss Skainseveshwar and she tolerated it well. She is also evaluated for OA of the hands with right CMC pain and was using CMC brace and Voltaren gel. Also had OA of the feet and causing some stiffness. She was given a refill of Skelaxin and Lidoderm patch.  She is returning back after 6 months and she states that she is doing well with her degenerative disc disease of the lumbar spine.  She is also doing well with the bilateral trapezius muscle spasms. Injection to Dr. Corliss Skainseveshwar gave her last visit continues to give her good relief.  For OA of the hands continue to be a source of problems. She is wearing a right CMC brace which is helping. She uses it when necessary. Lately she's had to use it more because she moved from her house in [place] to her house near [place]. She wants to continue to see us on an every six-month basis. She will try to find a rheumatologist in [place] but for the moment she wants to continue to see us.  She has not changed much in her symptoms since the May 2017 visit.   Activities of Daily Living:  Patient reports morning stiffness for 15 minutes.   Patient Reports nocturnal pain.  Difficulty dressing/grooming: Reports Difficulty climbing stairs: Reports Difficulty getting out of chair: Reports Difficulty using hands for taps, buttons, cutlery, and/or writing: Reports   Review of Systems  Constitutional: Negative for fatigue.  HENT: Negative for mouth sores and mouth dryness.   Eyes: Negative for dryness.   Respiratory: Negative for shortness of breath.   Gastrointestinal: Negative for constipation and diarrhea.  Musculoskeletal: Negative for myalgias and myalgias.  Skin: Negative for sensitivity to sunlight.  Psychiatric/Behavioral: Negative for decreased concentration and sleep disturbance.    PMFS History:  Patient Active Problem List   Diagnosis Date Noted  . DDD (degenerative disc disease), cervical 01/15/2016  . Scoliosis 01/15/2016  . Osteoarthritis of both hands 01/15/2016  . Osteoarthritis of both feet 01/15/2016  . Osteopenia 01/15/2016  . CAD (coronary artery disease) 05/31/2011  . HYPERLIPIDEMIA 05/03/2010  . BRONCHITIS, CHRONIC 04/30/2010  . HEMOPTYSIS UNSPECIFIED 04/30/2010    Past Medical History:  Diagnosis Date  . Arthritis   . Bronchitis   . Hyperlipidemia   . Lung nodules   . Osteoarthritis of both feet 01/15/2016  . Osteoarthritis of both hands 01/15/2016  . Scoliosis   . Sinusitis     Family History  Problem Relation Age of Onset  . Tuberculosis Father    Past Surgical History:  Procedure Laterality Date  . APPENDECTOMY    . TOTAL ABDOMINAL HYSTERECTOMY     Social History   Social History Narrative  . No narrative on file     Objective: Vital Signs: BP 131/71 (BP Location: Right Arm, Patient Position: Sitting, Cuff Size: Large)   Pulse 73   Resp 13   Ht 5'  4" (1.626 m)   Wt 126 lb (57.2 kg)   BMI 21.63 kg/m    Physical Exam  Constitutional: She is oriented to person, place, and time. She appears well-developed and well-nourished.  HENT:  Head: Normocephalic and atraumatic.  Eyes: EOM are normal. Pupils are equal, round, and reactive to light.  Cardiovascular: Normal rate, regular rhythm and normal heart sounds.  Exam reveals no gallop and no friction rub.   No murmur heard. Pulmonary/Chest: Effort normal and breath sounds normal. She has no wheezes. She has no rales.  Abdominal: Soft. Bowel sounds are normal. She exhibits no  distension. There is no tenderness. There is no guarding. No hernia.  Musculoskeletal: Normal range of motion. She exhibits no edema, tenderness or deformity.  Lymphadenopathy:    She has no cervical adenopathy.  Neurological: She is alert and oriented to person, place, and time. Coordination normal.  Skin: Skin is warm and dry. Capillary refill takes less than 2 seconds. No rash noted.  Psychiatric: She has a normal mood and affect. Her behavior is normal.  Nursing note and vitals reviewed.    Musculoskeletal Exam:  Full range of motion of all joints  grip strength is equal and strong bilaterally Fibromyalgia tender points are all absent  CDAI Exam: CDAI Homunculus Exam:   Joint Counts:  CDAI Tender Joint count: 0 CDAI Swollen Joint count: 0     Investigation: No additional findings.   Imaging: No results found.  Speciality Comments: No specialty comments available.    Procedures:  No procedures performed Allergies: Codeine   Assessment / Plan: Visit Diagnoses: Weakness - Plan: CBC with Differential/Platelet, COMPLETE METABOLIC PANEL WITH GFR, VITAMIN D 25 Hydroxy (Vit-D Deficiency, Fractures)  Osteoarthritis of lumbar spine, unspecified spinal osteoarthritis complication status  Osteoarthritis of both hands, unspecified osteoarthritis type  Osteoarthritis of ankle and foot, unspecified laterality   Patient gets very good relief with Lidoderm patches. This medicine works best for her and she has almost run out of her supply. She is asking for refill and we have refilled. I'll give her 30 patches with 5 refills. This will get her by until next visit in 6 months.  She was prescribed Skelaxin in the past. Dr. Corliss Skainseveshwar gave it to her and patient states that it helps but she does not need to use it too much. So therefore she has plenty of Skelaxin at home.  She has now moved to suburb of [place]. She will establish with a new rheumatologist.  I have given her  list of rheumatologists in the [place] area. She will also consult with her PCP to see who they recommended or are familiar with.  She continues to take her only supplements. There are very helpful.  We need to update her blood work and because she has some weakness going on we will add CBC with differential and vitamin D 25 OH. Because of the medications that she is using also a CMP with GFR. Patient is agreeable and finds it very convenient to have the blood drawn in our office rather than go to a lab site.   Orders: Orders Placed This Encounter  Procedures  . CBC with Differential/Platelet  . COMPLETE METABOLIC PANEL WITH GFR  . VITAMIN D 25 Hydroxy (Vit-D Deficiency, Fractures)   Meds ordered this encounter  Medications  . lidocaine (LIDODERM) 5 %    Sig: Place 1 patch onto the skin daily. Remove & Discard patch within 12 hours or as directed by MD  Dispense:  30 patch    Refill:  5    Face-to-face time spent with patient was 30 minutes. 50% of time was spent in counseling and coordination of care.  Follow-Up Instructions: Return in about 6 months (around 07/15/2016) for ddd L spine, oa hands, feet, right cmc pain.

## 2016-01-16 ENCOUNTER — Encounter: Payer: Self-pay | Admitting: Rheumatology

## 2016-01-16 ENCOUNTER — Ambulatory Visit (INDEPENDENT_AMBULATORY_CARE_PROVIDER_SITE_OTHER): Payer: Medicare Other | Admitting: Rheumatology

## 2016-01-16 VITALS — BP 131/71 | HR 73 | Resp 13 | Ht 64.0 in | Wt 126.0 lb

## 2016-01-16 DIAGNOSIS — M19042 Primary osteoarthritis, left hand: Secondary | ICD-10-CM

## 2016-01-16 DIAGNOSIS — M19041 Primary osteoarthritis, right hand: Secondary | ICD-10-CM | POA: Diagnosis not present

## 2016-01-16 DIAGNOSIS — R531 Weakness: Secondary | ICD-10-CM

## 2016-01-16 DIAGNOSIS — M47816 Spondylosis without myelopathy or radiculopathy, lumbar region: Secondary | ICD-10-CM | POA: Diagnosis not present

## 2016-01-16 DIAGNOSIS — M19079 Primary osteoarthritis, unspecified ankle and foot: Secondary | ICD-10-CM

## 2016-01-16 LAB — CBC WITH DIFFERENTIAL/PLATELET
BASOS ABS: 0 {cells}/uL (ref 0–200)
Basophils Relative: 0 %
EOS ABS: 210 {cells}/uL (ref 15–500)
EOS PCT: 3 %
HCT: 38.7 % (ref 35.0–45.0)
HEMOGLOBIN: 12.9 g/dL (ref 11.7–15.5)
LYMPHS ABS: 1960 {cells}/uL (ref 850–3900)
Lymphocytes Relative: 28 %
MCH: 30.3 pg (ref 27.0–33.0)
MCHC: 33.3 g/dL (ref 32.0–36.0)
MCV: 90.8 fL (ref 80.0–100.0)
MPV: 8.9 fL (ref 7.5–12.5)
Monocytes Absolute: 770 cells/uL (ref 200–950)
Monocytes Relative: 11 %
NEUTROS ABS: 4060 {cells}/uL (ref 1500–7800)
Neutrophils Relative %: 58 %
Platelets: 377 10*3/uL (ref 140–400)
RBC: 4.26 MIL/uL (ref 3.80–5.10)
RDW: 13.6 % (ref 11.0–15.0)
WBC: 7 10*3/uL (ref 3.8–10.8)

## 2016-01-16 LAB — COMPLETE METABOLIC PANEL WITH GFR
ALBUMIN: 4 g/dL (ref 3.6–5.1)
ALK PHOS: 63 U/L (ref 33–130)
ALT: 16 U/L (ref 6–29)
AST: 23 U/L (ref 10–35)
BILIRUBIN TOTAL: 0.3 mg/dL (ref 0.2–1.2)
BUN: 24 mg/dL (ref 7–25)
CO2: 27 mmol/L (ref 20–31)
CREATININE: 1 mg/dL — AB (ref 0.60–0.93)
Calcium: 10 mg/dL (ref 8.6–10.4)
Chloride: 104 mmol/L (ref 98–110)
GFR, EST NON AFRICAN AMERICAN: 54 mL/min — AB (ref >=60)
GFR, Est African American: 63 mL/min (ref >=60)
GLUCOSE: 125 mg/dL — AB (ref 65–99)
Potassium: 4.2 mmol/L (ref 3.5–5.3)
SODIUM: 143 mmol/L (ref 135–146)
TOTAL PROTEIN: 6.8 g/dL (ref 6.1–8.1)

## 2016-01-16 MED ORDER — LIDOCAINE 5 % EX PTCH: 1.0000 | MEDICATED_PATCH | CUTANEOUS | 5 refills | Status: DC

## 2016-01-17 LAB — VITAMIN D 25 HYDROXY (VIT D DEFICIENCY, FRACTURES): Vit D, 25-Hydroxy: 39 ng/mL (ref 30–100)

## 2016-01-17 NOTE — Progress Notes (Signed)
Vitamin D is normal No change in treatment CMP with gfr shows slightly elevated creatinine at 1.0  and we will monitor; glucose nonfasting elevated at  GFR slightly low at 54 which we will monitor;  Cbc w/ diff in normal  Pls inform pt and send to her new pcp in [place].

## 2016-01-22 ENCOUNTER — Telehealth: Payer: Self-pay | Admitting: Radiology

## 2016-01-22 NOTE — Telephone Encounter (Signed)
Called her to advise sent her a copy so she can get it to her PCP / I will call her in the am to get the fax number

## 2016-01-22 NOTE — Telephone Encounter (Signed)
-----   Message from Mountainaire[place]aitik Panwala, [place] sent at 01/17/2016  2:27 PM EDT ----- Vitamin D is normal No change in treatment CMP with gfr shows slightly elevated creatinine at 1.0  and we will monitor; glucose nonfasting elevated at  GFR slightly low at 54 which we will monitor;  Cbc w/ diff in normal  Pls inform pt and send to her new pcp in [place].

## 2016-01-23 NOTE — Telephone Encounter (Signed)
Called patient/ faxed the labs to her PCP in [place] mailed her a copy

## 2016-02-10 IMAGING — US US SOFT TISSUE HEAD/NECK
1 series · 14 of 25 positions shown · non-contrast
Comparison: 08/02/2012 and earlier studies

CLINICAL DATA: Thyroid nodules.

EXAM:
THYROID ULTRASOUND
TECHNIQUE: Ultrasound examination of the thyroid gland and adjacent soft
tissues was performed.

[Series 1: us soft tissue head/neck · 0.05mm/px · 14 of 51 slices shown]
[im 1/51]
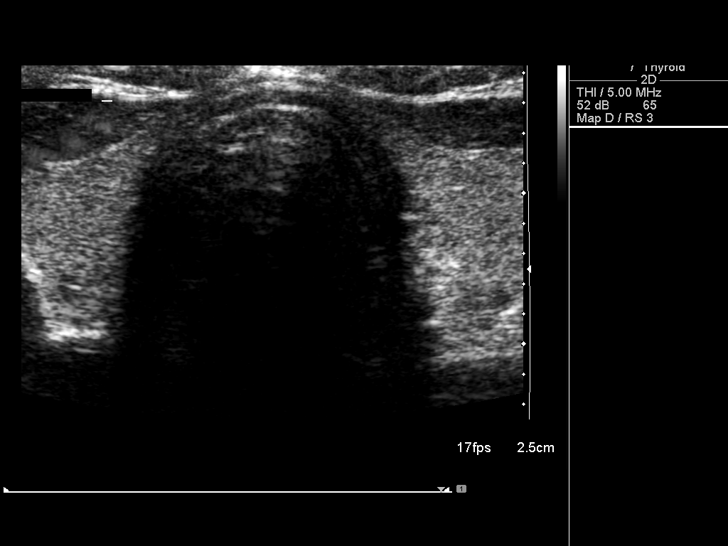
[im 5/51]
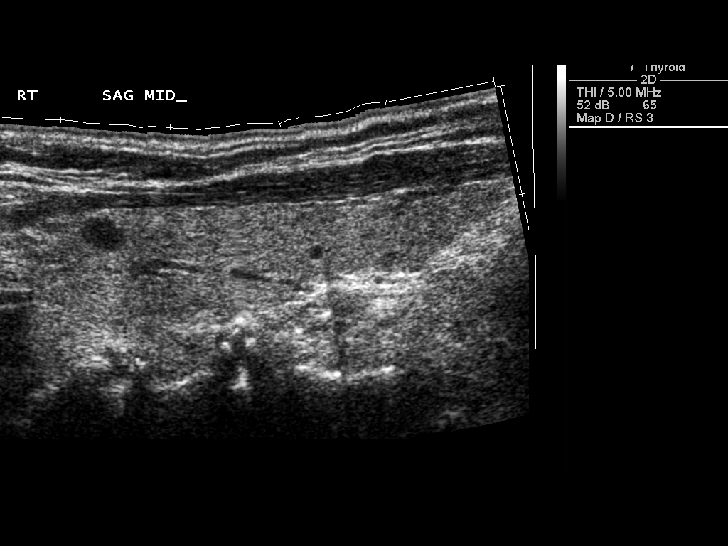
[im 9/51]
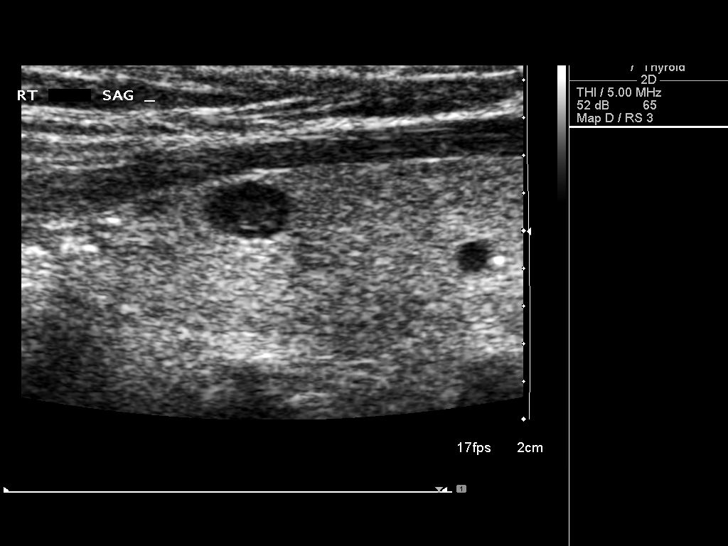
[im 13/51]
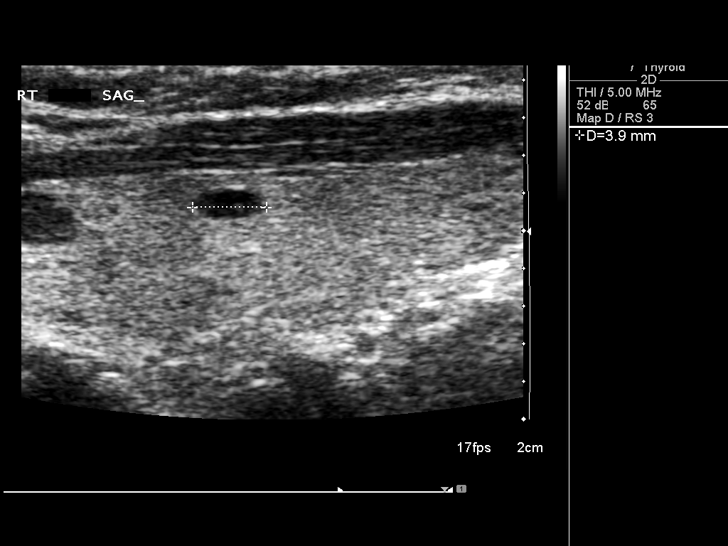
[im 17/51]
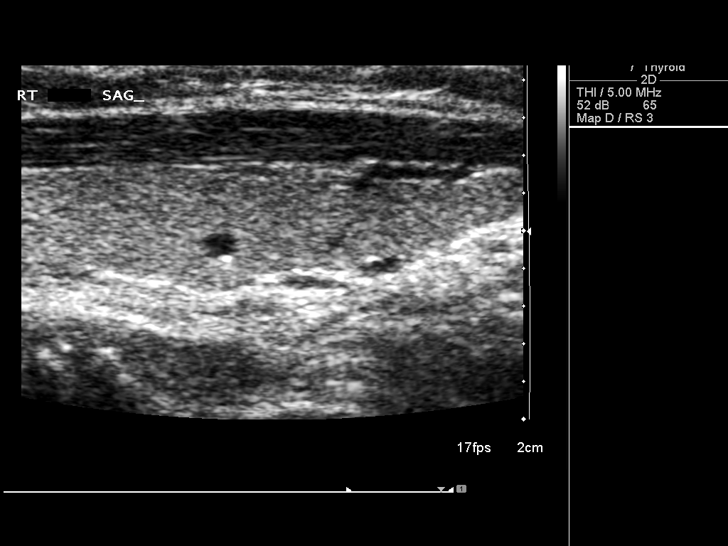
[im 19/51]
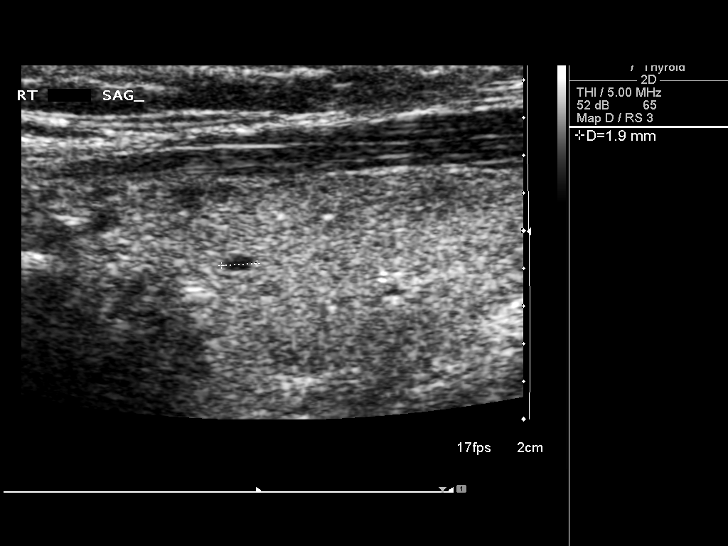
[im 23/51]
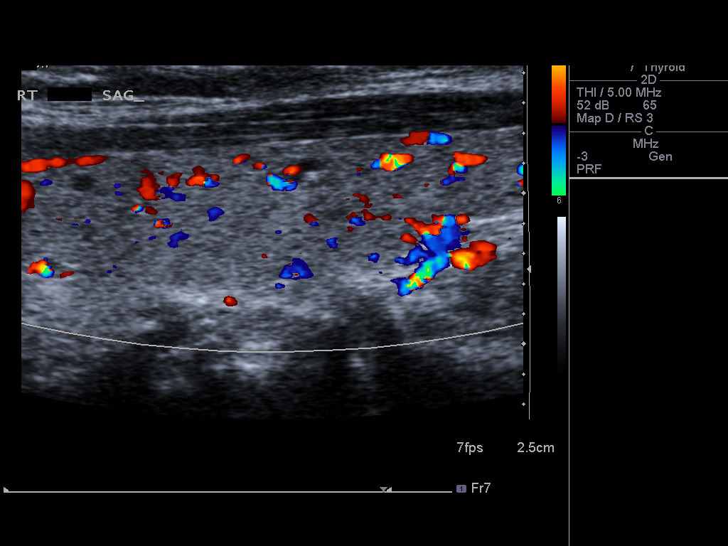
[im 28/51]
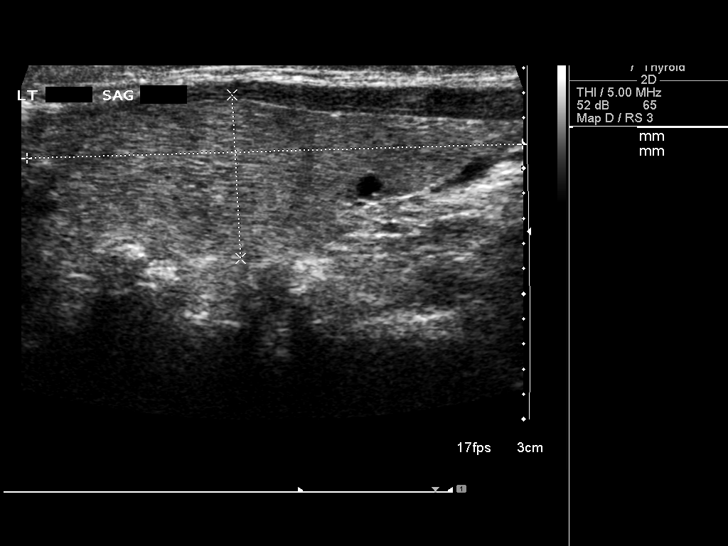
[im 32/51]
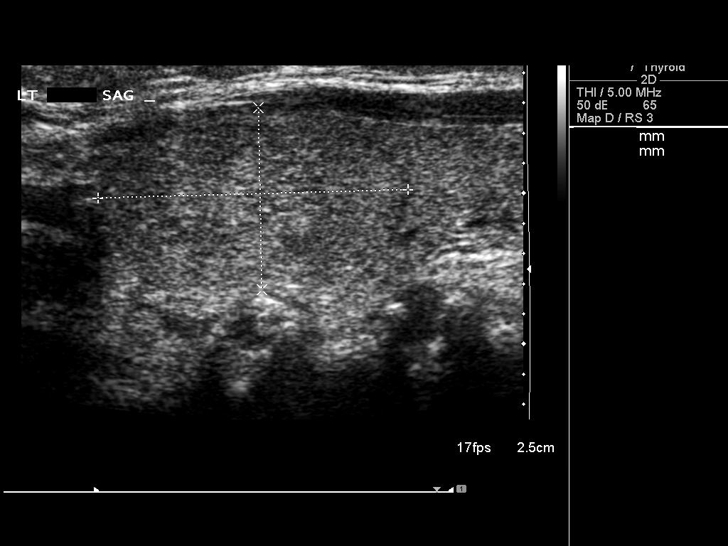
[im 34/51]
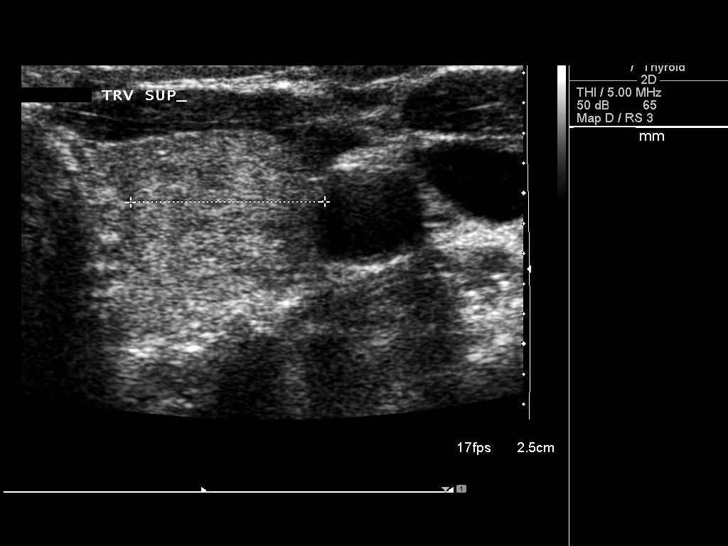
[im 38/51]
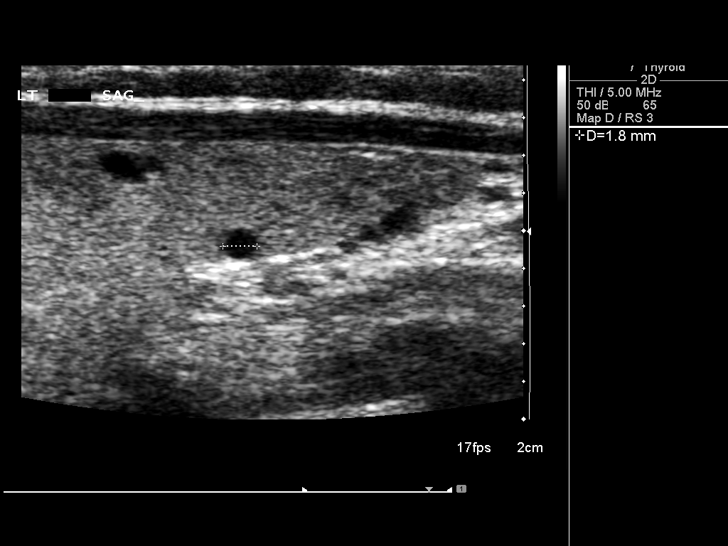
[im 42/51]
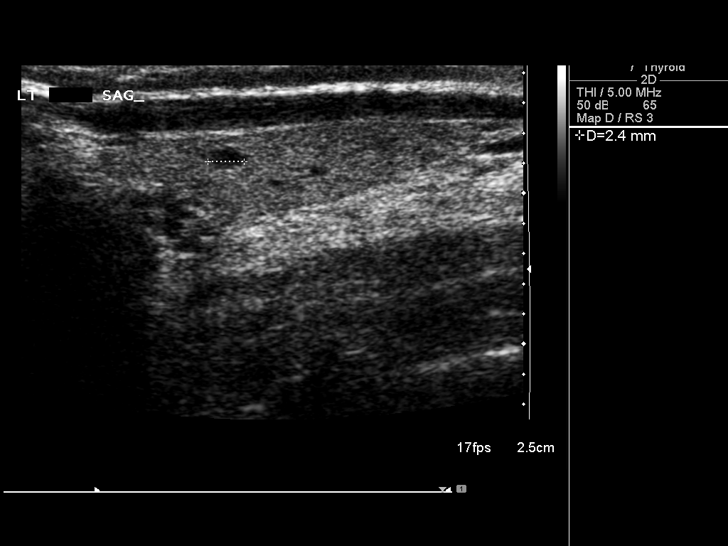
[im 46/51]
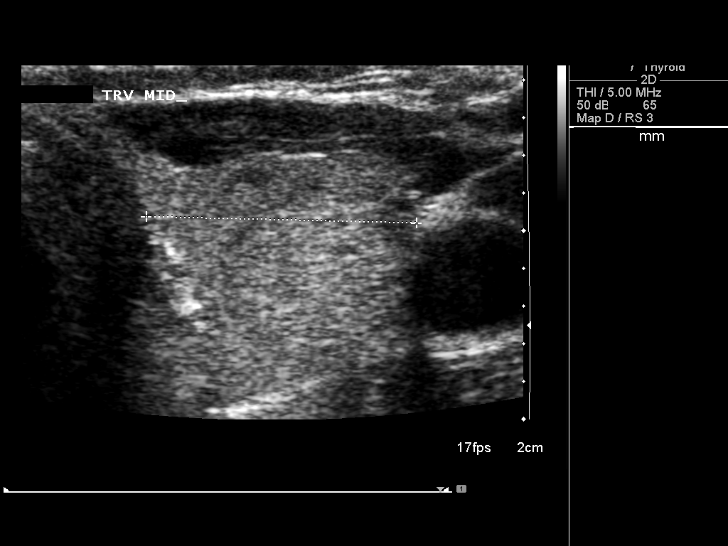
[im 51/51]
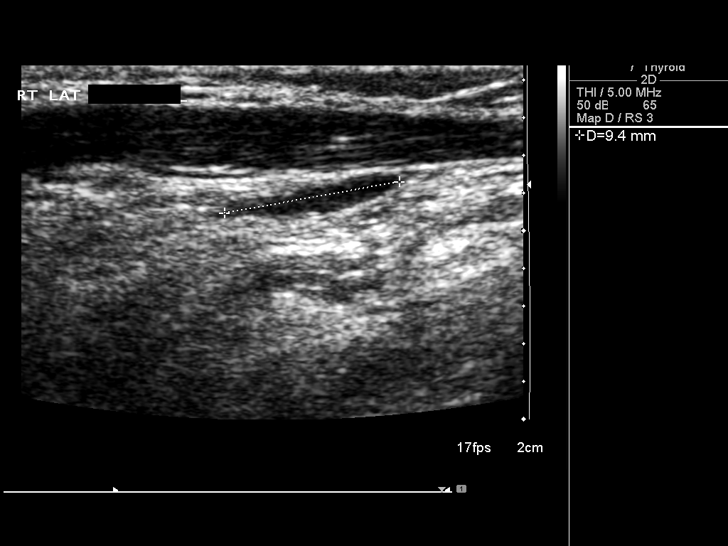

[14 of 25 positions shown; findings below may reference images not displayed]

FINDINGS: Right thyroid lobe

Measurements: 45 x 12 x 13 mm. Multiple small hypoechoic and cystic
lesions, largest 5 mm in the upper pole.

Left thyroid lobe

Measurements: 40 x 13 x 14 mm. Dominant 21 x 12 x 13 mm nearly
isoechoic nodule, upper pole (previously 19 x 12 x 13). Additional
smaller hypoechoic/ cystic lesions less than 5 mm.

Isthmus

Thickness: 1 mm.  No nodules visualized.

Lymphadenopathy

None visualized.
IMPRESSION: 1. Slight continued increase in size of dominant left nodule.
Findings meet consensus criteria for biopsy. Ultrasound-guided fine
needle aspiration should be considered, as per the consensus
statement: Management of Thyroid Nodules Detected at US: Society of
Radiologists in Ultrasound Consensus Conference Statement. Radiology

## 2016-07-11 ENCOUNTER — Telehealth: Payer: Self-pay | Admitting: Rheumatology

## 2016-07-11 NOTE — Telephone Encounter (Signed)
Patient called to cancel appt. She has moved to [place]. Patient wanted to let Dr. Corliss Skains know it has been a pleasure being associated with her, and she hopes one day Dr. gets a window in her exam rooms.

## 2016-07-15 ENCOUNTER — Ambulatory Visit: Payer: Medicare Other | Admitting: Rheumatology

## 2016-09-24 ENCOUNTER — Other Ambulatory Visit: Payer: Self-pay | Admitting: Rheumatology

## 2016-09-24 NOTE — Telephone Encounter (Signed)
Last Visit: 01/16/16 Next Visit: no follow up visit as [place]   Okay to refill Lidoderm Patches?

## 2016-09-24 NOTE — Telephone Encounter (Signed)
ok 

## 2021-07-29 ENCOUNTER — Emergency Department (HOSPITAL_COMMUNITY): Payer: Medicare PPO

## 2021-07-29 ENCOUNTER — Emergency Department (HOSPITAL_COMMUNITY)
Admission: EM | Admit: 2021-07-29 | Discharge: 2021-07-29 | Disposition: A | Discharge status: Home / Self Care | Payer: Medicare PPO | Attending: Emergency Medicine | Admitting: Emergency Medicine

## 2021-07-29 ENCOUNTER — Encounter (HOSPITAL_COMMUNITY): Payer: Self-pay | Admitting: *Deleted

## 2021-07-29 ENCOUNTER — Other Ambulatory Visit: Payer: Self-pay

## 2021-07-29 DIAGNOSIS — R0789 Other chest pain: Secondary | ICD-10-CM | POA: Diagnosis not present

## 2021-07-29 DIAGNOSIS — M542 Cervicalgia: Secondary | ICD-10-CM | POA: Diagnosis not present

## 2021-07-29 DIAGNOSIS — R109 Unspecified abdominal pain: Secondary | ICD-10-CM | POA: Insufficient documentation

## 2021-07-29 DIAGNOSIS — Y92410 Unspecified street and highway as the place of occurrence of the external cause: Secondary | ICD-10-CM | POA: Diagnosis not present

## 2021-07-29 DIAGNOSIS — I2510 Atherosclerotic heart disease of native coronary artery without angina pectoris: Secondary | ICD-10-CM | POA: Diagnosis not present

## 2021-07-29 DIAGNOSIS — I700 Atherosclerosis of aorta: Secondary | ICD-10-CM | POA: Insufficient documentation

## 2021-07-29 DIAGNOSIS — Z7982 Long term (current) use of aspirin: Secondary | ICD-10-CM | POA: Diagnosis not present

## 2021-07-29 DIAGNOSIS — S40212A Abrasion of left shoulder, initial encounter: Secondary | ICD-10-CM | POA: Diagnosis not present

## 2021-07-29 DIAGNOSIS — S4992XA Unspecified injury of left shoulder and upper arm, initial encounter: Secondary | ICD-10-CM | POA: Diagnosis present

## 2021-07-29 LAB — CBG MONITORING, ED: Glucose-Capillary: 122 mg/dL — ABNORMAL HIGH (ref 70–99)

## 2021-07-29 LAB — I-STAT CHEM 8, ED
BUN: 24 mg/dL — ABNORMAL HIGH (ref 8–23)
Calcium, Ion: 1.21 mmol/L (ref 1.15–1.40)
Chloride: 105 mmol/L (ref 98–111)
Creatinine, Ser: 0.7 mg/dL (ref 0.44–1.00)
Glucose, Bld: 114 mg/dL — ABNORMAL HIGH (ref 70–99)
HCT: 45 % (ref 36.0–46.0)
Hemoglobin: 15.3 g/dL — ABNORMAL HIGH (ref 12.0–15.0)
Potassium: 3.7 mmol/L (ref 3.5–5.1)
Sodium: 141 mmol/L (ref 135–145)
TCO2: 26 mmol/L (ref 22–32)

## 2021-07-29 MED ORDER — IOHEXOL 300 MG/ML  SOLN
100.0000 mL | Freq: Once | INTRAMUSCULAR | Status: AC | PRN
  Administered 2021-07-29: 100 mL via INTRAVENOUS

## 2021-07-29 MED ORDER — FENTANYL CITRATE PF 50 MCG/ML IJ SOSY
25.0000 ug | PREFILLED_SYRINGE | Freq: Once | INTRAMUSCULAR | Status: AC
  Administered 2021-07-29: 25 ug via INTRAVENOUS
  Filled 2021-07-29: qty 1
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
## 2021-07-29 NOTE — ED Notes (Signed)
Family at bedside. 
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
